# Patient Record
Sex: Female | Born: 1965 | Race: White | Hispanic: No | Marital: Married | Smoking: Former smoker
Health system: Southern US, Community
[De-identification: ages and names within clinical notes are randomized; demographics above are authoritative.]

## PROBLEM LIST (undated history)

## (undated) DIAGNOSIS — M542 Cervicalgia: Secondary | ICD-10-CM

## (undated) DIAGNOSIS — M48061 Spinal stenosis, lumbar region without neurogenic claudication: Secondary | ICD-10-CM

## (undated) DIAGNOSIS — K219 Gastro-esophageal reflux disease without esophagitis: Secondary | ICD-10-CM

## (undated) DIAGNOSIS — E785 Hyperlipidemia, unspecified: Secondary | ICD-10-CM

## (undated) HISTORY — DX: Cervicalgia: M54.2

## (undated) HISTORY — DX: Spinal stenosis, lumbar region without neurogenic claudication: M48.061

## (undated) HISTORY — DX: Hyperlipidemia, unspecified: E78.5

## (undated) HISTORY — PX: SPINAL FUSION: SHX223

## (undated) HISTORY — PX: BACK SURGERY: SHX140

## (undated) HISTORY — DX: Gastro-esophageal reflux disease without esophagitis: K21.9

## (undated) HISTORY — PX: WISDOM TOOTH EXTRACTION: SHX21

## (undated) HISTORY — PX: ANKLE SURGERY: SHX546

---

## 2003-02-12 ENCOUNTER — Other Ambulatory Visit: Admission: RE | Admit: 2003-02-12 | Discharge: 2003-02-12 | Payer: Self-pay | Admitting: Obstetrics and Gynecology

## 2004-02-20 ENCOUNTER — Other Ambulatory Visit: Admission: RE | Admit: 2004-02-20 | Discharge: 2004-02-20 | Payer: Self-pay | Admitting: Obstetrics and Gynecology

## 2004-11-25 ENCOUNTER — Encounter: Admission: RE | Admit: 2004-11-25 | Discharge: 2004-11-25 | Payer: Self-pay | Admitting: Family Medicine

## 2005-03-06 ENCOUNTER — Other Ambulatory Visit: Admission: RE | Admit: 2005-03-06 | Discharge: 2005-03-06 | Payer: Self-pay | Admitting: Obstetrics and Gynecology

## 2006-12-28 ENCOUNTER — Emergency Department: Payer: Self-pay | Admitting: Emergency Medicine

## 2007-07-18 ENCOUNTER — Ambulatory Visit: Payer: Self-pay | Admitting: Unknown Physician Specialty

## 2007-08-23 ENCOUNTER — Ambulatory Visit: Payer: Self-pay | Admitting: Pain Medicine

## 2007-09-12 ENCOUNTER — Ambulatory Visit: Payer: Self-pay | Admitting: Pain Medicine

## 2007-10-13 ENCOUNTER — Ambulatory Visit: Payer: Self-pay | Admitting: Pain Medicine

## 2007-11-14 ENCOUNTER — Ambulatory Visit: Payer: Self-pay | Admitting: Pain Medicine

## 2007-12-15 ENCOUNTER — Ambulatory Visit: Payer: Self-pay | Admitting: Pain Medicine

## 2008-01-02 ENCOUNTER — Ambulatory Visit: Payer: Self-pay | Admitting: Pain Medicine

## 2008-01-30 ENCOUNTER — Ambulatory Visit: Payer: Self-pay | Admitting: Pain Medicine

## 2008-02-28 ENCOUNTER — Ambulatory Visit: Payer: Self-pay | Admitting: Pain Medicine

## 2008-04-17 ENCOUNTER — Ambulatory Visit: Payer: Self-pay | Admitting: Neurosurgery

## 2008-08-14 ENCOUNTER — Encounter: Payer: Self-pay | Admitting: Orthopedic Surgery

## 2008-08-28 ENCOUNTER — Encounter: Payer: Self-pay | Admitting: Orthopedic Surgery

## 2008-09-27 ENCOUNTER — Encounter: Payer: Self-pay | Admitting: Orthopedic Surgery

## 2009-06-03 ENCOUNTER — Encounter: Admission: RE | Admit: 2009-06-03 | Discharge: 2009-06-03 | Payer: Self-pay | Admitting: Obstetrics and Gynecology

## 2013-01-31 ENCOUNTER — Encounter: Payer: Self-pay | Admitting: Podiatry

## 2013-01-31 ENCOUNTER — Ambulatory Visit (INDEPENDENT_AMBULATORY_CARE_PROVIDER_SITE_OTHER): Payer: 59 | Admitting: Podiatry

## 2013-01-31 VITALS — BP 115/71 | HR 69 | Resp 16 | Ht 65.0 in | Wt 172.0 lb

## 2013-01-31 DIAGNOSIS — M722 Plantar fascial fibromatosis: Secondary | ICD-10-CM

## 2013-01-31 NOTE — Patient Instructions (Signed)

## 2013-01-31 NOTE — Progress Notes (Signed)
Subjective:     Patient ID: Alicia Woods, female   DOB: Nov 22, 1965, 47 y.o.   MRN: [id number]  HPI patient states I am doing pretty well. States that she is still having pain when on her feet for too long a period of time   Review of Systems  All other systems reviewed and are negative.       Objective:   Physical Exam  Nursing note and vitals reviewed. Constitutional: She is oriented to person, place, and time.  Cardiovascular: Intact distal pulses.   Neurological: She is oriented to person, place, and time.  Skin: Skin is warm.   patient does have structural abnormality of both feet consistent with flatfoot deformity and structural bunion and heel pain-like condition     Assessment:     Chronic structural issues secondary to foot structure and mechanical dysfunction    Plan:     Explained condition the patient and recommended second type of orthotic. Dispense primary orthotic today with instructions on use

## 2013-02-14 ENCOUNTER — Encounter: Payer: Self-pay | Admitting: Podiatry

## 2013-02-14 ENCOUNTER — Ambulatory Visit (INDEPENDENT_AMBULATORY_CARE_PROVIDER_SITE_OTHER): Payer: 59

## 2013-02-14 ENCOUNTER — Ambulatory Visit (INDEPENDENT_AMBULATORY_CARE_PROVIDER_SITE_OTHER): Payer: 59 | Admitting: Podiatry

## 2013-02-14 VITALS — BP 111/63 | HR 69 | Resp 16 | Ht 65.0 in | Wt 173.0 lb

## 2013-02-14 DIAGNOSIS — Z5189 Encounter for other specified aftercare: Secondary | ICD-10-CM

## 2013-02-14 DIAGNOSIS — S96911D Strain of unspecified muscle and tendon at ankle and foot level, right foot, subsequent encounter: Secondary | ICD-10-CM

## 2013-02-14 DIAGNOSIS — M79609 Pain in unspecified limb: Secondary | ICD-10-CM

## 2013-02-14 DIAGNOSIS — M79671 Pain in right foot: Secondary | ICD-10-CM

## 2013-02-14 NOTE — Progress Notes (Signed)
Subjective:     Patient ID: Alicia Woods, female   DOB: [birth year], 47 y.o.   MRN: [id number]  HPI patient states I cannot wear my orthotics because I felt a pop in my right foot and now my arch is killing me and wearing orthotics is painful date she is having trouble walking due to the injury she sustained   Review of Systems     Objective:   Physical Exam  Nursing note and vitals reviewed. Constitutional: She is oriented to person, place, and time.  Cardiovascular: Intact distal pulses.   Musculoskeletal: Normal range of motion.  Neurological: She is oriented to person, place, and time.  Skin: Skin is warm.   patient is found to have pain in the arch right and upon comparison I do think there has been tearing of part of the plantar fascia with no bruising noted it within the arch    Assessment:     Partial tear of the plantar fascia right foot    Plan:     Education concerning condition given to patient and today I applied air fracture walker with instructions on heat ice and immobilization of the arch to allow healing reappoint her recheck in 2-3 weeks

## 2013-03-10 ENCOUNTER — Ambulatory Visit: Payer: 59 | Admitting: Podiatry

## 2013-03-21 ENCOUNTER — Ambulatory Visit (INDEPENDENT_AMBULATORY_CARE_PROVIDER_SITE_OTHER): Payer: 59 | Admitting: Podiatry

## 2013-03-21 ENCOUNTER — Encounter: Payer: Self-pay | Admitting: Podiatry

## 2013-03-21 VITALS — BP 109/60 | HR 78 | Resp 16 | Ht 65.0 in | Wt 183.0 lb

## 2013-03-21 DIAGNOSIS — M722 Plantar fascial fibromatosis: Secondary | ICD-10-CM

## 2013-03-21 MED ORDER — TRIAMCINOLONE ACETONIDE 10 MG/ML IJ SUSP
10.0000 mg | Freq: Once | INTRAMUSCULAR | Status: AC
  Administered 2013-03-21: 10 mg

## 2013-03-21 NOTE — Progress Notes (Signed)
Subjective:     Patient ID: Margit Banda, female   DOB: 21-Nov-1965, 47 y.o.   MRN: [id number]  HPI patient presents stating I was doing really well but now the pain has moved to a new position more distal and more medial on my foot stating this one spot is very sore   Review of Systems     Objective:   Physical Exam Neurovascular status unchanged with no edema noted the foot but quite a bit of pain in the right arch distal to the insertion into the calcaneus on the medial side    Assessment:     Appears to be an inflammatory area more medial than where previous injury was present    Plan:     Did a very careful superficial injection 3 mg Kenalog 5 of Xylocaine Marcaine mixture and advised on boot usage for the next several weeks. Reappoint her recheck in 4 weeks

## 2013-04-18 ENCOUNTER — Encounter: Payer: Self-pay | Admitting: Podiatry

## 2013-04-18 ENCOUNTER — Ambulatory Visit (INDEPENDENT_AMBULATORY_CARE_PROVIDER_SITE_OTHER): Payer: 59 | Admitting: Podiatry

## 2013-04-18 VITALS — BP 124/74 | HR 78 | Resp 12

## 2013-04-18 DIAGNOSIS — M722 Plantar fascial fibromatosis: Secondary | ICD-10-CM

## 2013-04-18 NOTE — Progress Notes (Signed)
Subjective:     Patient ID: Alicia Woods, female   DOB: 11-Sep-1965, 48 y.o.   MRN: [id number]  HPI patient states that this spot on her right foot is still hurting and her orthotics are not comfortable   Review of Systems     Objective:   Physical Exam Neurovascular status intact with continued discomfort in the medial side of the right foot with inflammation noted patient is found to have diminished discomfort but still present    Assessment:     Continued plantar fascial symptomatology with also possible tear of some of the fibers which may be contributed into the pain she is experiencing    Plan:     Reviewed importance of boot and we're going to try to change her orthotic in a different direction. I want her to start tramadol which she states she has at home and if she needs any she will call us

## 2013-05-09 ENCOUNTER — Encounter: Payer: Self-pay | Admitting: *Deleted

## 2013-05-09 NOTE — Progress Notes (Signed)
SENT PT POSTCARD LETTING HER KNOW ORTHOTICS ARE IN. 

## 2013-05-12 ENCOUNTER — Encounter: Payer: Self-pay | Admitting: Podiatry

## 2013-05-15 DIAGNOSIS — M79673 Pain in unspecified foot: Secondary | ICD-10-CM | POA: Insufficient documentation

## 2013-05-15 DIAGNOSIS — M7750 Other enthesopathy of unspecified foot: Secondary | ICD-10-CM | POA: Insufficient documentation

## 2013-05-15 DIAGNOSIS — Q667 Congenital pes cavus, unspecified foot: Secondary | ICD-10-CM | POA: Insufficient documentation

## 2013-05-15 DIAGNOSIS — M722 Plantar fascial fibromatosis: Secondary | ICD-10-CM | POA: Insufficient documentation

## 2014-01-12 ENCOUNTER — Emergency Department: Payer: Self-pay | Admitting: Emergency Medicine

## 2014-01-12 LAB — COMPREHENSIVE METABOLIC PANEL
ALBUMIN: 3.3 g/dL — AB (ref 3.4–5.0)
ALT: 89 U/L — AB
AST: 88 U/L — AB (ref 15–37)
Alkaline Phosphatase: 39 U/L — ABNORMAL LOW
Anion Gap: 7 (ref 7–16)
BILIRUBIN TOTAL: 0.6 mg/dL (ref 0.2–1.0)
BUN: 17 mg/dL (ref 7–18)
Calcium, Total: 8.1 mg/dL — ABNORMAL LOW (ref 8.5–10.1)
Chloride: 108 mmol/L — ABNORMAL HIGH (ref 98–107)
Co2: 25 mmol/L (ref 21–32)
Creatinine: 0.73 mg/dL (ref 0.60–1.30)
EGFR (African American): >60
GLUCOSE: 95 mg/dL (ref 65–99)
OSMOLALITY: 281 (ref 275–301)
Potassium: 3.5 mmol/L (ref 3.5–5.1)
Sodium: 140 mmol/L (ref 136–145)
TOTAL PROTEIN: 6.9 g/dL (ref 6.4–8.2)

## 2014-01-12 LAB — URINALYSIS, COMPLETE
Bilirubin,UR: NEGATIVE
Glucose,UR: NEGATIVE mg/dL (ref 0–75)
KETONE: NEGATIVE
Nitrite: NEGATIVE
PH: 7 (ref 4.5–8.0)
PROTEIN: NEGATIVE
RBC,UR: 3 /HPF (ref 0–5)
SPECIFIC GRAVITY: 1.016 (ref 1.003–1.030)
Squamous Epithelial: 4

## 2014-01-12 LAB — CBC
HCT: 37.5 % (ref 35.0–47.0)
HGB: 12.8 g/dL (ref 12.0–16.0)
MCH: 32 pg (ref 26.0–34.0)
MCHC: 34.1 g/dL (ref 32.0–36.0)
MCV: 94 fL (ref 80–100)
PLATELETS: 333 10*3/uL (ref 150–440)
RBC: 4 10*6/uL (ref 3.80–5.20)
RDW: 12.2 % (ref 11.5–14.5)
WBC: 8.3 10*3/uL (ref 3.6–11.0)

## 2014-01-12 LAB — TROPONIN I

## 2014-01-12 LAB — CK TOTAL AND CKMB (NOT AT ARMC)
CK, Total: 111 U/L
CK-MB: 1.1 ng/mL (ref 0.5–3.6)

## 2014-01-22 DIAGNOSIS — M546 Pain in thoracic spine: Secondary | ICD-10-CM | POA: Insufficient documentation

## 2014-01-22 DIAGNOSIS — M5116 Intervertebral disc disorders with radiculopathy, lumbar region: Secondary | ICD-10-CM | POA: Insufficient documentation

## 2014-02-07 ENCOUNTER — Ambulatory Visit: Payer: Self-pay | Admitting: Adult Health Nurse Practitioner

## 2014-02-07 LAB — HCG, QUANTITATIVE, PREGNANCY

## 2014-03-13 ENCOUNTER — Other Ambulatory Visit: Payer: Self-pay | Admitting: Neurosurgery

## 2014-03-13 DIAGNOSIS — M48061 Spinal stenosis, lumbar region without neurogenic claudication: Secondary | ICD-10-CM

## 2014-03-21 ENCOUNTER — Ambulatory Visit
Admission: RE | Admit: 2014-03-21 | Discharge: 2014-03-21 | Disposition: A | Discharge status: Home / Self Care | Payer: PRIVATE HEALTH INSURANCE | Source: Ambulatory Visit | Attending: Neurosurgery | Admitting: Neurosurgery

## 2014-03-21 DIAGNOSIS — M48061 Spinal stenosis, lumbar region without neurogenic claudication: Secondary | ICD-10-CM

## 2014-03-21 MED ORDER — IOHEXOL 180 MG/ML  SOLN
1.0000 mL | Freq: Once | INTRAMUSCULAR | Status: AC | PRN
  Administered 2014-03-21: 1 mL via EPIDURAL

## 2014-03-21 MED ORDER — METHYLPREDNISOLONE ACETATE 40 MG/ML INJ SUSP (RADIOLOG
120.0000 mg | Freq: Once | INTRAMUSCULAR | Status: AC
  Administered 2014-03-21: 120 mg via EPIDURAL

## 2014-03-21 NOTE — Discharge Instructions (Signed)

## 2015-12-26 ENCOUNTER — Other Ambulatory Visit: Payer: Self-pay | Admitting: Advanced Practice Midwife

## 2015-12-26 DIAGNOSIS — Z1231 Encounter for screening mammogram for malignant neoplasm of breast: Secondary | ICD-10-CM

## 2016-01-23 ENCOUNTER — Encounter: Payer: Self-pay | Admitting: Radiology

## 2016-01-23 ENCOUNTER — Ambulatory Visit
Admission: RE | Admit: 2016-01-23 | Discharge: 2016-01-23 | Disposition: A | Discharge status: Home / Self Care | Payer: 59 | Source: Ambulatory Visit | Attending: Advanced Practice Midwife | Admitting: Advanced Practice Midwife

## 2016-01-23 DIAGNOSIS — Z1231 Encounter for screening mammogram for malignant neoplasm of breast: Secondary | ICD-10-CM | POA: Diagnosis not present

## 2016-04-22 ENCOUNTER — Other Ambulatory Visit: Payer: Self-pay | Admitting: Physical Medicine and Rehabilitation

## 2016-04-22 DIAGNOSIS — M5412 Radiculopathy, cervical region: Secondary | ICD-10-CM

## 2016-04-30 ENCOUNTER — Ambulatory Visit
Admission: RE | Admit: 2016-04-30 | Discharge: 2016-04-30 | Disposition: A | Discharge status: Home / Self Care | Payer: 59 | Source: Ambulatory Visit | Attending: Physical Medicine and Rehabilitation | Admitting: Physical Medicine and Rehabilitation

## 2016-04-30 DIAGNOSIS — M4682 Other specified inflammatory spondylopathies, cervical region: Secondary | ICD-10-CM | POA: Diagnosis not present

## 2016-04-30 DIAGNOSIS — M50223 Other cervical disc displacement at C6-C7 level: Secondary | ICD-10-CM | POA: Insufficient documentation

## 2016-04-30 DIAGNOSIS — M5412 Radiculopathy, cervical region: Secondary | ICD-10-CM

## 2016-04-30 DIAGNOSIS — M7138 Other bursal cyst, other site: Secondary | ICD-10-CM | POA: Insufficient documentation

## 2016-04-30 DIAGNOSIS — M4802 Spinal stenosis, cervical region: Secondary | ICD-10-CM | POA: Insufficient documentation

## 2016-12-07 ENCOUNTER — Other Ambulatory Visit: Payer: Self-pay

## 2016-12-07 ENCOUNTER — Telehealth: Payer: Self-pay

## 2016-12-07 MED ORDER — NORETHIN-ETH ESTRAD-FE BIPHAS 1 MG-10 MCG / 10 MCG PO TABS: 1.0000 | ORAL_TABLET | Freq: Every day | ORAL | 0 refills | Status: DC

## 2016-12-07 NOTE — Telephone Encounter (Signed)
I don't see message on this.

## 2016-12-07 NOTE — Telephone Encounter (Signed)
I thought I had put a note in here, pt needs OCP refill to last to appt 12/23/2016 with [facility]. 1 refill sent. Pt aware via vm

## 2016-12-09 ENCOUNTER — Other Ambulatory Visit: Payer: Self-pay

## 2016-12-09 ENCOUNTER — Telehealth: Payer: Self-pay

## 2016-12-09 NOTE — Telephone Encounter (Signed)
Trying to call pt back to let her know I have fixed the RX with [facility]. No answer. Please let her know when she calls back.

## 2016-12-14 ENCOUNTER — Other Ambulatory Visit: Payer: Self-pay

## 2016-12-14 ENCOUNTER — Telehealth: Payer: Self-pay

## 2016-12-14 MED ORDER — NORETHIN-ETH ESTRAD-FE BIPHAS 1 MG-10 MCG / 10 MCG PO TABS: 1.0000 | ORAL_TABLET | Freq: Every day | ORAL | 0 refills | Status: DC

## 2016-12-14 NOTE — Telephone Encounter (Signed)
Pt called, [facility] has not received rx.  Adv refill would be eRx'd.

## 2016-12-14 NOTE — Progress Notes (Signed)
Pt states [facility] does not have 90d rx and [facility] won't fill it d/t it has to be mail order.  Adv pt I would eRx 90d supply to [facility].

## 2016-12-14 NOTE — Telephone Encounter (Signed)
Pt called after hour nurse 12/10/16 5:14pm stating a 28d supply was called in on her bcp.  It is mail order so she needs a 90d supply. [facility] [phone]. Left detailed msg for pt that BS has fixed the rx c [facility].  Pt to call back if not fixed.

## 2016-12-23 ENCOUNTER — Ambulatory Visit: Payer: 59 | Admitting: Obstetrics and Gynecology

## 2017-02-15 ENCOUNTER — Encounter: Payer: Self-pay | Admitting: Obstetrics and Gynecology

## 2017-02-15 ENCOUNTER — Ambulatory Visit (INDEPENDENT_AMBULATORY_CARE_PROVIDER_SITE_OTHER): Payer: 59 | Admitting: Obstetrics and Gynecology

## 2017-02-15 VITALS — BP 130/80 | HR 72 | Ht 65.5 in | Wt 190.0 lb

## 2017-02-15 DIAGNOSIS — Z1231 Encounter for screening mammogram for malignant neoplasm of breast: Secondary | ICD-10-CM

## 2017-02-15 DIAGNOSIS — Z3041 Encounter for surveillance of contraceptive pills: Secondary | ICD-10-CM

## 2017-02-15 DIAGNOSIS — Z1239 Encounter for other screening for malignant neoplasm of breast: Secondary | ICD-10-CM

## 2017-02-15 DIAGNOSIS — Z1151 Encounter for screening for human papillomavirus (HPV): Secondary | ICD-10-CM | POA: Diagnosis not present

## 2017-02-15 DIAGNOSIS — Z01419 Encounter for gynecological examination (general) (routine) without abnormal findings: Secondary | ICD-10-CM | POA: Diagnosis not present

## 2017-02-15 DIAGNOSIS — Z124 Encounter for screening for malignant neoplasm of cervix: Secondary | ICD-10-CM | POA: Diagnosis not present

## 2017-02-15 MED ORDER — NORETHIN-ETH ESTRAD-FE BIPHAS 1 MG-10 MCG / 10 MCG PO TABS: 1.0000 | ORAL_TABLET | Freq: Every day | ORAL | 3 refills | Status: DC

## 2017-02-15 NOTE — Patient Instructions (Signed)
I value your feedback and entrusting us with your care. If you get a [facility] patient survey, I would appreciate you taking the time to let us know about your experience today. Thank you! 

## 2017-02-15 NOTE — Progress Notes (Signed)
PCP:  Maryland Pink, MD   Chief Complaint  Patient presents with  . Gynecologic Exam    TALK ABOUT BCPs ; needs refill also     HPI:      Alicia Woods is a 51 y.o. No obstetric history on file. who LMP was No LMP recorded. Patient is postmenopausal., presents today for her annual examination.  Her menses are absent with cont dosing Lo Loestrin OCPs.   Dysmenorrhea none. She does not have intermenstrual bleeding.  Sex activity: single partner, contraception - OCP (estrogen/progesterone).  Last Pap: October 06, 2013  Results were: no abnormalities /neg HPV DNA  Hx of STDs: none  Last mammogram: January 23, 2016  Results were: normal--routine follow-up in 12 months There is a FH of breast cancer in her MGM, age unkown. There is no FH of ovarian cancer. The patient does not do self-breast exams.  Tobacco use: The patient denies current or previous tobacco use. Alcohol use: none No drug use.  Exercise: not active  She does get adequate calcium and Vitamin D in her diet.  Labs with PCP.   Past Medical History:  Diagnosis Date  . Neck pain on left side    CAUSED BY CYST AND CAUSES SHOULDER PAIN;  DR. Sharlet Salina    Past Surgical History:  Procedure Laterality Date  . ANKLE SURGERY Left   . SPINAL FUSION    . WISDOM TOOTH EXTRACTION     TWO    Family History  Problem Relation Age of Onset  . Diabetes Mother   . Breast cancer Maternal Grandmother     Social History   Socioeconomic History  . Marital status: Married    Spouse name: Not on file  . Number of children: Not on file  . Years of education: Not on file  . Highest education level: Not on file  Social Needs  . Financial resource strain: Not on file  . Food insecurity - worry: Not on file  . Food insecurity - inability: Not on file  . Transportation needs - medical: Not on file  . Transportation needs - non-medical: Not on file  Occupational History  . Not on file  Tobacco Use  . Smoking status:  Former Research scientist (life sciences)  . Smokeless tobacco: Never Used  . Tobacco comment: quit 2009  Substance and Sexual Activity  . Alcohol use: Yes    Comment: social   . Drug use: No  . Sexual activity: Yes    Birth control/protection: Post-menopausal  Other Topics Concern  . Not on file  Social History Narrative  . Not on file    Current Meds  Medication Sig  . Calcium Citrate 250 MG TABS Take 1 capsule daily by mouth.  . Cholecalciferol (VITAMIN D3) 2000 units capsule Take 1 capsule daily by mouth.  . cyclobenzaprine (FLEXERIL) 5 MG tablet Take 1 tablet as needed by mouth.  . gabapentin (NEURONTIN) 300 MG capsule Take 1 capsule daily by mouth.  Marland Kitchen omeprazole (PRILOSEC) 20 MG capsule Take 1 capsule daily by mouth.  . Potassium 99 MG TABS Take 1 tablet daily by mouth.  Marland Kitchen VITAMIN B COMPLEX-C CAPS Take 1 capsule daily by mouth.  . [DISCONTINUED] norethindrone-ethinyl estradiol (JUNEL FE,GILDESS FE,LOESTRIN FE) 1-20 MG-MCG tablet Take 1 tablet daily by mouth.     ROS:  Review of Systems  Constitutional: Negative for fatigue, fever and unexpected weight change.  Respiratory: Negative for cough, shortness of breath and wheezing.   Cardiovascular: Negative for chest  pain, palpitations and leg swelling.  Gastrointestinal: Negative for blood in stool, constipation, diarrhea, nausea and vomiting.  Endocrine: Negative for cold intolerance, heat intolerance and polyuria.  Genitourinary: Negative for dyspareunia, dysuria, flank pain, frequency, genital sores, hematuria, menstrual problem, pelvic pain, urgency, vaginal bleeding, vaginal discharge and vaginal pain.  Musculoskeletal: Negative for back pain, joint swelling and myalgias.  Skin: Negative for rash.  Neurological: Negative for dizziness, syncope, light-headedness, numbness and headaches.  Hematological: Negative for adenopathy.  Psychiatric/Behavioral: Negative for agitation, confusion, sleep disturbance and suicidal ideas. The patient is not  nervous/anxious.      Objective: BP 130/80   Pulse 72   Ht 5' 5.5" (1.664 m)   Wt 190 lb (86.2 kg)   BMI 31.14 kg/m    Physical Exam  Constitutional: She is oriented to person, place, and time. She appears well-developed and well-nourished.  Genitourinary: Vagina normal and uterus normal. There is no rash or tenderness on the right labia. There is no rash or tenderness on the left labia. No erythema or tenderness in the vagina. No vaginal discharge found. Right adnexum does not display mass and does not display tenderness. Left adnexum does not display mass and does not display tenderness. Cervix does not exhibit motion tenderness or polyp. Uterus is not enlarged or tender.  Neck: Normal range of motion. No thyromegaly present.  Cardiovascular: Normal rate, regular rhythm and normal heart sounds.  No murmur heard. Pulmonary/Chest: Effort normal and breath sounds normal. Right breast exhibits no mass, no nipple discharge, no skin change and no tenderness. Left breast exhibits no mass, no nipple discharge, no skin change and no tenderness.  Abdominal: Soft. There is no tenderness. There is no guarding.  Musculoskeletal: Normal range of motion.  Neurological: She is alert and oriented to person, place, and time. No cranial nerve deficit.  Psychiatric: She has a normal mood and affect. Her behavior is normal.  Vitals reviewed.   Assessment/Plan: Encounter for annual routine gynecological examination  Cervical cancer screening - Plan: IGP, Aptima HPV  Screening for HPV (human papillomavirus) - Plan: IGP, Aptima HPV  Screening for breast cancer - Pt to sched mammo. - Plan: MM DIGITAL SCREENING BILATERAL  Encounter for surveillance of contraceptive pills - OCP RF. Will re-eval need next yr. Try placebo pills to see if has any withdrawal bleeding. - Plan: Norethindrone-Ethinyl Estradiol-Fe Biphas (LO LOESTRIN FE) 1 MG-10 MCG / 10 MCG tablet            GYN counsel breast self exam,  mammography screening, use and side effects of OCP's, adequate intake of calcium and vitamin D, diet and exercise     F/U  Return in about 1 year (around 02/15/2018).  Alicia B. Copland, PA-C 02/15/2017 8:51 AM

## 2017-02-17 LAB — IGP, APTIMA HPV
HPV Aptima: NEGATIVE
PAP Smear Comment: 0

## 2017-03-09 ENCOUNTER — Ambulatory Visit
Admission: RE | Admit: 2017-03-09 | Discharge: 2017-03-09 | Disposition: A | Discharge status: Home / Self Care | Payer: 59 | Source: Ambulatory Visit | Attending: Obstetrics and Gynecology | Admitting: Obstetrics and Gynecology

## 2017-03-09 DIAGNOSIS — N6320 Unspecified lump in the left breast, unspecified quadrant: Secondary | ICD-10-CM | POA: Insufficient documentation

## 2017-03-09 DIAGNOSIS — Z1231 Encounter for screening mammogram for malignant neoplasm of breast: Secondary | ICD-10-CM | POA: Diagnosis present

## 2017-03-09 DIAGNOSIS — R928 Other abnormal and inconclusive findings on diagnostic imaging of breast: Secondary | ICD-10-CM | POA: Insufficient documentation

## 2017-03-09 DIAGNOSIS — Z1239 Encounter for other screening for malignant neoplasm of breast: Secondary | ICD-10-CM

## 2017-03-09 DIAGNOSIS — N6310 Unspecified lump in the right breast, unspecified quadrant: Secondary | ICD-10-CM | POA: Insufficient documentation

## 2017-03-11 ENCOUNTER — Other Ambulatory Visit: Payer: Self-pay | Admitting: Obstetrics and Gynecology

## 2017-03-11 DIAGNOSIS — N6320 Unspecified lump in the left breast, unspecified quadrant: Secondary | ICD-10-CM

## 2017-03-11 DIAGNOSIS — R928 Other abnormal and inconclusive findings on diagnostic imaging of breast: Secondary | ICD-10-CM

## 2017-03-11 DIAGNOSIS — N6310 Unspecified lump in the right breast, unspecified quadrant: Secondary | ICD-10-CM

## 2017-03-29 ENCOUNTER — Other Ambulatory Visit: Payer: 59

## 2017-03-29 ENCOUNTER — Ambulatory Visit: Payer: 59

## 2017-04-06 ENCOUNTER — Ambulatory Visit
Admission: RE | Admit: 2017-04-06 | Discharge: 2017-04-06 | Disposition: A | Discharge status: Home / Self Care | Payer: 59 | Source: Ambulatory Visit | Attending: Obstetrics and Gynecology | Admitting: Obstetrics and Gynecology

## 2017-04-06 ENCOUNTER — Encounter: Payer: Self-pay | Admitting: Obstetrics and Gynecology

## 2017-04-06 DIAGNOSIS — R928 Other abnormal and inconclusive findings on diagnostic imaging of breast: Secondary | ICD-10-CM | POA: Diagnosis not present

## 2017-04-06 DIAGNOSIS — N6310 Unspecified lump in the right breast, unspecified quadrant: Secondary | ICD-10-CM

## 2017-04-06 DIAGNOSIS — N6320 Unspecified lump in the left breast, unspecified quadrant: Secondary | ICD-10-CM

## 2017-04-06 DIAGNOSIS — N6001 Solitary cyst of right breast: Secondary | ICD-10-CM | POA: Diagnosis not present

## 2017-11-22 ENCOUNTER — Other Ambulatory Visit: Payer: Self-pay

## 2017-11-22 DIAGNOSIS — Z3041 Encounter for surveillance of contraceptive pills: Secondary | ICD-10-CM

## 2017-11-22 MED ORDER — NORETHIN-ETH ESTRAD-FE BIPHAS 1 MG-10 MCG / 10 MCG PO TABS: 1.0000 | ORAL_TABLET | Freq: Every day | ORAL | 0 refills | Status: DC

## 2018-01-13 ENCOUNTER — Other Ambulatory Visit: Payer: Self-pay | Admitting: Family Medicine

## 2018-01-13 DIAGNOSIS — Z1231 Encounter for screening mammogram for malignant neoplasm of breast: Secondary | ICD-10-CM

## 2018-01-18 ENCOUNTER — Ambulatory Visit
Admission: RE | Admit: 2018-01-18 | Discharge: 2018-01-18 | Disposition: A | Discharge status: Home / Self Care | Payer: 59 | Source: Ambulatory Visit | Attending: Family Medicine | Admitting: Family Medicine

## 2018-01-18 ENCOUNTER — Ambulatory Visit: Payer: PRIVATE HEALTH INSURANCE

## 2018-01-18 DIAGNOSIS — Z1231 Encounter for screening mammogram for malignant neoplasm of breast: Secondary | ICD-10-CM | POA: Diagnosis not present

## 2018-02-16 ENCOUNTER — Encounter: Payer: Self-pay | Admitting: Obstetrics and Gynecology

## 2018-02-16 ENCOUNTER — Ambulatory Visit (INDEPENDENT_AMBULATORY_CARE_PROVIDER_SITE_OTHER): Payer: PRIVATE HEALTH INSURANCE | Admitting: Obstetrics and Gynecology

## 2018-02-16 VITALS — BP 108/74 | HR 82 | Ht 65.5 in | Wt 180.0 lb

## 2018-02-16 DIAGNOSIS — Z1211 Encounter for screening for malignant neoplasm of colon: Secondary | ICD-10-CM

## 2018-02-16 DIAGNOSIS — Z01419 Encounter for gynecological examination (general) (routine) without abnormal findings: Secondary | ICD-10-CM

## 2018-02-16 DIAGNOSIS — Z1239 Encounter for other screening for malignant neoplasm of breast: Secondary | ICD-10-CM

## 2018-02-16 DIAGNOSIS — Z3041 Encounter for surveillance of contraceptive pills: Secondary | ICD-10-CM

## 2018-02-16 MED ORDER — NORETHIN-ETH ESTRAD-FE BIPHAS 1 MG-10 MCG / 10 MCG PO TABS: 1.0000 | ORAL_TABLET | Freq: Every day | ORAL | 3 refills | Status: DC

## 2018-02-16 NOTE — Progress Notes (Signed)
PCP:  Maryland Pink, MD   Chief Complaint  Patient presents with  . Gynecologic Exam     HPI:      [name] is a 52 y.o. No obstetric history on file. who LMP was No LMP recorded. Patient is postmenopausal., presents today for her annual examination.  Her menses are absent with cont dosing Lo Loestrin OCPs. She tried doing placebo pills this yr and had withdrawal bleed.  Dysmenorrhea none. She does not have intermenstrual bleeding.  Sex activity: single partner, contraception - OCP (estrogen/progesterone).  Last Pap: 02/15/17  Results were: no abnormalities /neg HPV DNA  Hx of STDs: none  Last mammogram: 01/18/18  Results were: normal--routine follow-up in 12 months There is a FH of breast cancer in her MGM, age unkown. There is no FH of ovarian cancer. The patient does not do self-breast exams.  Tobacco use: The patient denies current or previous tobacco use. Alcohol use: none No drug use.  Exercise: not active  Colonoscopy: never  She does get adequate calcium and Vitamin D in her diet.  Labs with PCP.   Past Medical History:  Diagnosis Date  . Esophageal reflux   . Neck pain on left side    CAUSED BY CYST AND CAUSES SHOULDER PAIN;  DR. Sharlet Salina    Past Surgical History:  Procedure Laterality Date  . ANKLE SURGERY Left   . SPINAL FUSION    . WISDOM TOOTH EXTRACTION     TWO    Family History  Problem Relation Age of Onset  . Diabetes Mother        Type 1  . Breast cancer Maternal Grandmother        age unknown  . Other Father        Polio  . Muscular dystrophy Sister     Social History   Socioeconomic History  . Marital status: Married    Spouse name: Not on file  . Number of children: Not on file  . Years of education: Not on file  . Highest education level: Not on file  Occupational History  . Not on file  Social Needs  . Financial resource strain: Not on file  . Food insecurity:    Worry: Not on file    Inability: Not on file    . Transportation needs:    Medical: Not on file    Non-medical: Not on file  Tobacco Use  . Smoking status: Former Research scientist (life sciences)  . Smokeless tobacco: Never Used  . Tobacco comment: quit 2009  Substance and Sexual Activity  . Alcohol use: Yes    Comment: social   . Drug use: No  . Sexual activity: Yes    Birth control/protection: Post-menopausal  Lifestyle  . Physical activity:    Days per week: Not on file    Minutes per session: Not on file  . Stress: Not on file  Relationships  . Social connections:    Talks on phone: Not on file    Gets together: Not on file    Attends religious service: Not on file    Active member of club or organization: Not on file    Attends meetings of clubs or organizations: Not on file    Relationship status: Not on file  . Intimate partner violence:    Fear of current or ex partner: Not on file    Emotionally abused: Not on file    Physically abused: Not on file    Forced  sexual activity: Not on file  Other Topics Concern  . Not on file  Social History Narrative  . Not on file    Current Meds  Medication Sig  . Calcium Citrate 250 MG TABS Take 1 capsule daily by mouth.  . Cholecalciferol (VITAMIN D3) 2000 units capsule Take 1 capsule daily by mouth.  . diphenhydrAMINE (BENADRYL) 25 mg capsule Take 1-2 tabs PO QHS  . gabapentin (NEURONTIN) 300 MG capsule Take 1 capsule daily by mouth.  . Norethindrone-Ethinyl Estradiol-Fe Biphas (LO LOESTRIN FE) 1 MG-10 MCG / 10 MCG tablet Take 1 tablet by mouth daily.  Marland Kitchen omeprazole (PRILOSEC) 20 MG capsule Take 1 capsule daily by mouth.  . Potassium 99 MG TABS Take 1 tablet daily by mouth.  Marland Kitchen VITAMIN B COMPLEX-C CAPS Take 1 capsule daily by mouth.  . [DISCONTINUED] Norethindrone-Ethinyl Estradiol-Fe Biphas (LO LOESTRIN FE) 1 MG-10 MCG / 10 MCG tablet Take 1 tablet by mouth daily.     ROS:  Review of Systems  Constitutional: Negative for fatigue, fever and unexpected weight change.  Respiratory: Negative  for cough, shortness of breath and wheezing.   Cardiovascular: Negative for chest pain, palpitations and leg swelling.  Gastrointestinal: Negative for blood in stool, constipation, diarrhea, nausea and vomiting.  Endocrine: Negative for cold intolerance, heat intolerance and polyuria.  Genitourinary: Negative for dyspareunia, dysuria, flank pain, frequency, genital sores, hematuria, menstrual problem, pelvic pain, urgency, vaginal bleeding, vaginal discharge and vaginal pain.  Musculoskeletal: Negative for back pain, joint swelling and myalgias.  Skin: Negative for rash.  Neurological: Negative for dizziness, syncope, light-headedness, numbness and headaches.  Hematological: Negative for adenopathy.  Psychiatric/Behavioral: Negative for agitation, confusion, sleep disturbance and suicidal ideas. The patient is not nervous/anxious.      Objective: BP 108/74   Pulse 82   Ht 5' 5.5" (1.664 m)   Wt 180 lb (81.6 kg)   BMI 29.50 kg/m    Physical Exam  Constitutional: She is oriented to person, place, and time. She appears well-developed and well-nourished.  Genitourinary: Vagina normal and uterus normal. There is no rash or tenderness on the right labia. There is no rash or tenderness on the left labia. No erythema or tenderness in the vagina. No vaginal discharge found. Right adnexum does not display mass and does not display tenderness. Left adnexum does not display mass and does not display tenderness. Cervix does not exhibit motion tenderness or polyp. Uterus is not enlarged or tender.  Neck: Normal range of motion. No thyromegaly present.  Cardiovascular: Normal rate, regular rhythm and normal heart sounds.  No murmur heard. Pulmonary/Chest: Effort normal and breath sounds normal. Right breast exhibits no mass, no nipple discharge, no skin change and no tenderness. Left breast exhibits no mass, no nipple discharge, no skin change and no tenderness.  Abdominal: Soft. There is no  tenderness. There is no guarding.  Musculoskeletal: Normal range of motion.  Neurological: She is alert and oriented to person, place, and time. No cranial nerve deficit.  Psychiatric: She has a normal mood and affect. Her behavior is normal.  Vitals reviewed.   Assessment/Plan: Encounter for annual routine gynecological examination  Screening for breast cancer - Pt current on mammo  Encounter for surveillance of contraceptive pills - OCP RF. Re-eval need for them next yr due to age. - Plan: Norethindrone-Ethinyl Estradiol-Fe Biphas (LO LOESTRIN FE) 1 MG-10 MCG / 10 MCG tablet  Screening for colon cancer - Recommended scr colonoscopy due to age. Ref sent. - Plan: Ambulatory referral  to Gastroenterology            GYN counsel breast self exam, mammography screening, use and side effects of OCP's, adequate intake of calcium and vitamin D, diet and exercise     F/U  Return in about 1 year (around 02/17/2019).  Tierra Thoma B. Reily Ilic, PA-C 02/16/2018 4:58 PM

## 2018-02-16 NOTE — Patient Instructions (Signed)
I value your feedback and entrusting us with your care. If you get a [facility] patient survey, I would appreciate you taking the time to let us know about your experience today. Thank you! 

## 2018-02-17 ENCOUNTER — Other Ambulatory Visit: Payer: Self-pay

## 2018-02-17 DIAGNOSIS — Z1211 Encounter for screening for malignant neoplasm of colon: Secondary | ICD-10-CM

## 2018-03-07 ENCOUNTER — Encounter: Payer: Self-pay | Admitting: *Deleted

## 2018-03-07 ENCOUNTER — Encounter
Admission: RE | Disposition: A | Discharge status: Home / Self Care | Payer: Self-pay | Source: Home / Self Care | Attending: Gastroenterology

## 2018-03-07 ENCOUNTER — Ambulatory Visit: Payer: 59 | Admitting: Anesthesiology

## 2018-03-07 ENCOUNTER — Ambulatory Visit
Admission: RE | Admit: 2018-03-07 | Discharge: 2018-03-07 | Disposition: A | Discharge status: Home / Self Care | Payer: 59 | Attending: Gastroenterology | Admitting: Gastroenterology

## 2018-03-07 ENCOUNTER — Other Ambulatory Visit: Payer: Self-pay

## 2018-03-07 DIAGNOSIS — K219 Gastro-esophageal reflux disease without esophagitis: Secondary | ICD-10-CM | POA: Diagnosis not present

## 2018-03-07 DIAGNOSIS — Z79899 Other long term (current) drug therapy: Secondary | ICD-10-CM | POA: Diagnosis not present

## 2018-03-07 DIAGNOSIS — Z87891 Personal history of nicotine dependence: Secondary | ICD-10-CM | POA: Diagnosis not present

## 2018-03-07 DIAGNOSIS — Z1211 Encounter for screening for malignant neoplasm of colon: Secondary | ICD-10-CM | POA: Diagnosis present

## 2018-03-07 DIAGNOSIS — K621 Rectal polyp: Secondary | ICD-10-CM | POA: Diagnosis not present

## 2018-03-07 DIAGNOSIS — K635 Polyp of colon: Secondary | ICD-10-CM | POA: Insufficient documentation

## 2018-03-07 HISTORY — PX: COLONOSCOPY WITH PROPOFOL: SHX5780

## 2018-03-07 SURGERY — COLONOSCOPY WITH PROPOFOL
Anesthesia: General

## 2018-03-07 MED ORDER — LIDOCAINE HCL (CARDIAC) PF 100 MG/5ML IV SOSY
PREFILLED_SYRINGE | INTRAVENOUS | Status: DC | PRN
  Administered 2018-03-07: 50 mg via INTRAVENOUS

## 2018-03-07 MED ORDER — MIDAZOLAM HCL 2 MG/2ML IJ SOLN
INTRAMUSCULAR | Status: DC | PRN
  Administered 2018-03-07: 2 mg via INTRAVENOUS

## 2018-03-07 MED ORDER — LIDOCAINE HCL (PF) 2 % IJ SOLN
INTRAMUSCULAR | Status: AC
  Filled 2018-03-07: qty 10

## 2018-03-07 MED ORDER — PROPOFOL 10 MG/ML IV BOLUS
INTRAVENOUS | Status: DC | PRN
  Administered 2018-03-07: 90 mg via INTRAVENOUS

## 2018-03-07 MED ORDER — MIDAZOLAM HCL 2 MG/2ML IJ SOLN
INTRAMUSCULAR | Status: AC
  Filled 2018-03-07: qty 2

## 2018-03-07 MED ORDER — FENTANYL CITRATE (PF) 100 MCG/2ML IJ SOLN
INTRAMUSCULAR | Status: AC
  Filled 2018-03-07: qty 2

## 2018-03-07 MED ORDER — PROPOFOL 500 MG/50ML IV EMUL
INTRAVENOUS | Status: AC
  Filled 2018-03-07: qty 50

## 2018-03-07 MED ORDER — FENTANYL CITRATE (PF) 100 MCG/2ML IJ SOLN
INTRAMUSCULAR | Status: DC | PRN
  Administered 2018-03-07: 50 ug via INTRAVENOUS

## 2018-03-07 MED ORDER — SODIUM CHLORIDE 0.9 % IV SOLN
INTRAVENOUS | Status: DC
  Administered 2018-03-07: 09:00:00 via INTRAVENOUS

## 2018-03-07 MED ORDER — PROPOFOL 500 MG/50ML IV EMUL
INTRAVENOUS | Status: DC | PRN
  Administered 2018-03-07: 130 ug/kg/min via INTRAVENOUS

## 2018-03-07 NOTE — Op Note (Signed)
[facility] Gastroenterology Patient Name: Alicia Woods Procedure Date: 03/07/2018 9:28 AM MRN: [id number] Account #: [id number] Date of Birth: 1966-01-03 Admit Type: Outpatient Age: 52 Room: [facility] ENDO ROOM 2 Gender: Female Note Status: Finalized Procedure:            Colonoscopy Indications:          Screening for colorectal malignant neoplasm, This is                        the patient's first colonoscopy Providers:            Lin Landsman MD, MD Referring MD:         Irven Easterly. Kary Kos, MD (Referring MD) Medicines:            Monitored Anesthesia Care Complications:        No immediate complications. Estimated blood loss: None. Procedure:            Pre-Anesthesia Assessment:                       - Prior to the procedure, a History and Physical was                        performed, and patient medications and allergies were                        reviewed. The patient is competent. The risks and                        benefits of the procedure and the sedation options and                        risks were discussed with the patient. All questions                        were answered and informed consent was obtained.                        Patient identification and proposed procedure were                        verified by the physician, the nurse, the                        anesthesiologist, the anesthetist and the technician in                        the pre-procedure area in the procedure room in the                        endoscopy suite. Mental Status Examination: alert and                        oriented. Airway Examination: normal oropharyngeal                        airway and neck mobility. Respiratory Examination:                        clear to auscultation. CV Examination: normal.  Prophylactic Antibiotics: The patient does not require                        prophylactic antibiotics. Prior Anticoagulants: The                         patient has taken no previous anticoagulant or                        antiplatelet agents. ASA Grade Assessment: II - A                        patient with mild systemic disease. After reviewing the                        risks and benefits, the patient was deemed in                        satisfactory condition to undergo the procedure. The                        anesthesia plan was to use monitored anesthesia care                        (MAC). Immediately prior to administration of                        medications, the patient was re-assessed for adequacy                        to receive sedatives. The heart rate, respiratory rate,                        oxygen saturations, blood pressure, adequacy of                        pulmonary ventilation, and response to care were                        monitored throughout the procedure. The physical status                        of the patient was re-assessed after the procedure.                       After obtaining informed consent, the colonoscope was                        passed under direct vision. Throughout the procedure,                        the patient's blood pressure, pulse, and oxygen                        saturations were monitored continuously. The                        Colonoscope was introduced through the anus and  advanced to the the cecum, identified by appendiceal                        orifice and ileocecal valve. The colonoscopy was                        performed without difficulty. The patient tolerated the                        procedure well. The quality of the bowel preparation                        was evaluated using the BBPS [facility] Bowel Preparation                        Scale) with scores of: Right Colon = 3, Transverse                        Colon = 3 and Left Colon = 3 (entire mucosa seen well                        with no residual staining, small fragments of stool or                         opaque liquid). The total BBPS score equals 9. Findings:      The perianal and digital rectal examinations were normal. Pertinent       negatives include normal sphincter tone and no palpable rectal lesions.      A 5 mm polyp was found in the rectum. The polyp was sessile. The polyp       was removed with a cold snare. Resection and retrieval were complete.      The entire examined colon appeared normal.      The exam was otherwise without abnormality.      The retroflexed view of the distal rectum and anal verge was normal and       showed no anal or rectal abnormalities. Impression:           - One 5 mm polyp in the rectum, removed with a cold                        snare. Resected and retrieved.                       - The entire examined colon is normal.                       - The examination was otherwise normal.                       - The distal rectum and anal verge are normal on                        retroflexion view. Recommendation:       - Discharge patient to home (with spouse).                       - Resume previous diet today.                       -  Continue present medications.                       - Await pathology results.                       - Repeat colonoscopy in 5-10 years for surveillance                        based on pathology results. Procedure Code(s):    --- Professional ---                       [phone], Colonoscopy, flexible; with removal of tumor(s),                        polyp(s), or other lesion(s) by snare technique Diagnosis Code(s):    --- Professional ---                       Z12.11, Encounter for screening for malignant neoplasm                        of colon                       K62.1, Rectal polyp CPT copyright 2018 American Medical Association. All rights reserved. The codes documented in this report are preliminary and upon coder review may  be revised to meet current compliance requirements. Dr. Ulyess Mort Lin Landsman MD, MD 03/07/2018 9:48:58 AM This report has been signed electronically. Number of Addenda: 0 Note Initiated On: 03/07/2018 9:28 AM Scope Withdrawal Time: 0 hours 8 minutes 57 seconds  Total Procedure Duration: 0 hours 13 minutes 37 seconds       [facility]

## 2018-03-07 NOTE — Anesthesia Postprocedure Evaluation (Signed)
Anesthesia Post Note  Patient: Alicia Woods  Procedure(s) Performed: COLONOSCOPY WITH PROPOFOL (N/A )  Patient location during evaluation: Endoscopy Anesthesia Type: General Level of consciousness: awake and alert Pain management: pain level controlled Vital Signs Assessment: post-procedure vital signs reviewed and stable Respiratory status: spontaneous breathing, nonlabored ventilation, respiratory function stable and patient connected to nasal cannula oxygen Cardiovascular status: blood pressure returned to baseline and stable Postop Assessment: no apparent nausea or vomiting Anesthetic complications: no     Last Vitals:  Vitals:   03/07/18 1012 03/07/18 1022  BP: 98/64 96/62  Pulse: 71 65  Resp: 16 14  Temp:    SpO2: 99% 100%    Last Pain:  Vitals:   03/07/18 1022  TempSrc:   PainSc: 0-No pain                 Precious Haws Piscitello

## 2018-03-07 NOTE — Anesthesia Preprocedure Evaluation (Signed)
Anesthesia Evaluation  Patient identified by MRN, date of birth, ID band Patient awake    Reviewed: Allergy & Precautions, H&P , NPO status , Patient's Chart, lab work & pertinent test results  History of Anesthesia Complications Negative for: history of anesthetic complications  Airway Mallampati: III  TM Distance: <3 FB Neck ROM: full    Dental  (+) Chipped   Pulmonary neg shortness of breath, former smoker,  Signs and symptoms suggestive of sleep apnea            Cardiovascular Exercise Tolerance: Good (-) angina(-) Past MI and (-) DOE negative cardio ROS       Neuro/Psych negative neurological ROS  negative psych ROS   GI/Hepatic Neg liver ROS, GERD  Medicated and Controlled,  Endo/Other  negative endocrine ROS  Renal/GU negative Renal ROS  negative genitourinary   Musculoskeletal   Abdominal   Peds  Hematology negative hematology ROS (+)   Anesthesia Other Findings Past Medical History: No date: Esophageal reflux No date: Neck pain on left side     Comment:  CAUSED BY CYST AND CAUSES SHOULDER PAIN;  DR. Sharlet Salina  Past Surgical History: No date: ANKLE SURGERY; Left No date: BACK SURGERY No date: SPINAL FUSION No date: WISDOM TOOTH EXTRACTION     Comment:  TWO  BMI    Body Mass Index:  29.12 kg/m      Reproductive/Obstetrics negative OB ROS                             Anesthesia Physical Anesthesia Plan  ASA: III  Anesthesia Plan: General   Post-op Pain Management:    Induction: Intravenous  PONV Risk Score and Plan: Propofol infusion and TIVA  Airway Management Planned: Natural Airway and Nasal Cannula  Additional Equipment:   Intra-op Plan:   Post-operative Plan:   Informed Consent: I have reviewed the patients History and Physical, chart, labs and discussed the procedure including the risks, benefits and alternatives for the proposed anesthesia with the  patient or authorized representative who has indicated his/her understanding and acceptance.   Dental Advisory Given  Plan Discussed with: Anesthesiologist, CRNA and Surgeon  Anesthesia Plan Comments: (Patient consented for risks of anesthesia including but not limited to:  - adverse reactions to medications - risk of intubation if required - damage to teeth, lips or other oral mucosa - sore throat or hoarseness - Damage to heart, brain, lungs or loss of life  Patient voiced understanding.)        Anesthesia Quick Evaluation

## 2018-03-07 NOTE — Transfer of Care (Signed)
Immediate Anesthesia Transfer of Care Note  Patient: Alicia Woods  Procedure(s) Performed: COLONOSCOPY WITH PROPOFOL (N/A )  Patient Location: Endoscopy Unit  Anesthesia Type:General  Level of Consciousness: drowsy  Airway & Oxygen Therapy: Patient Spontanous Breathing  Post-op Assessment: Report given to RN and Post -op Vital signs reviewed and stable  Post vital signs: Reviewed and stable  Last Vitals:  Vitals Value Taken Time  BP 89/65 03/07/2018  9:51 AM  Temp    Pulse 79 03/07/2018  9:51 AM  Resp 14 03/07/2018  9:51 AM  SpO2 97 % 03/07/2018  9:51 AM  Vitals shown include unvalidated device data.  Last Pain:  Vitals:   03/07/18 0810  TempSrc: Tympanic  PainSc: 0-No pain         Complications: No apparent anesthesia complications

## 2018-03-07 NOTE — Anesthesia Post-op Follow-up Note (Signed)
Anesthesia QCDR form completed.        

## 2018-03-07 NOTE — H&P (Signed)
Cephas Darby, MD [address]  [place]  [place], [place] [address]  Main: [phone]  Fax: [phone] Pager: [phone]  Primary Care Physician:  Maryland Pink, MD Primary Gastroenterologist:  Dr. Cephas Darby  Pre-Procedure History & Physical: HPI:  Alicia Woods is a 52 y.o. female is here for an colonoscopy.   Past Medical History:  Diagnosis Date  . Esophageal reflux   . Neck pain on left side    CAUSED BY CYST AND CAUSES SHOULDER PAIN;  DR. Sharlet Salina    Past Surgical History:  Procedure Laterality Date  . ANKLE SURGERY Left   . BACK SURGERY    . SPINAL FUSION    . WISDOM TOOTH EXTRACTION     TWO    Prior to Admission medications   Medication Sig Start Date End Date Taking? Authorizing Provider  Calcium Citrate 250 MG TABS Take 1 capsule daily by mouth.   Yes [provider]  Cholecalciferol (VITAMIN D3) 2000 units capsule Take 1 capsule daily by mouth.   Yes [provider]  diphenhydrAMINE (BENADRYL) 25 mg capsule Take 1-2 tabs PO QHS 02/07/18  Yes [provider]  gabapentin (NEURONTIN) 300 MG capsule Take 1 capsule daily by mouth. 11/26/16  Yes [provider]  Norethindrone-Ethinyl Estradiol-Fe Biphas (LO LOESTRIN FE) 1 MG-10 MCG / 10 MCG tablet Take 1 tablet by mouth daily. 96/29/52  Yes Copland, Deirdre Evener, PA-C  omeprazole (PRILOSEC) 20 MG capsule Take 1 capsule daily by mouth. 02/08/17  Yes [provider]  Potassium 99 MG TABS Take 1 tablet daily by mouth.   Yes [provider]  VITAMIN B COMPLEX-C CAPS Take 1 capsule daily by mouth.   Yes [provider]    Allergies as of 02/17/2018 - Review Complete 02/16/2018  Allergen Reaction Noted  . Ciprofloxacin Hives   . Ciprocinonide [fluocinolone] Itching 01/31/2013  . Robitussin (alcohol free) [guaifenesin] Swelling 01/31/2013  . Penicillins Rash 01/31/2013    Family History  Problem Relation Age of Onset  . Diabetes Mother        Type 1  . Breast cancer Maternal Grandmother        age unknown  . Other Father        Polio  . Muscular dystrophy Sister     Social History   Socioeconomic History  . Marital status: Married    Spouse name: Not on file  . Number of children: Not on file  . Years of education: Not on file  . Highest education level: Not on file  Occupational History  . Not on file  Social Needs  . Financial resource strain: Not on file  . Food insecurity:    Worry: Not on file    Inability: Not on file  . Transportation needs:    Medical: Not on file    Non-medical: Not on file  Tobacco Use  . Smoking status: Former Research scientist (life sciences)  . Smokeless tobacco: Never Used  . Tobacco comment: quit 2009  Substance and Sexual Activity  . Alcohol use: Yes    Comment: social   . Drug use: No  . Sexual activity: Yes    Birth control/protection: Post-menopausal  Lifestyle  . Physical activity:    Days per week: Not on file    Minutes per session: Not on file  . Stress: Not on file  Relationships  . Social connections:    Talks on phone: Not on file    Gets together: Not on  file    Attends religious service: Not on file    Active member of club or organization: Not on file    Attends meetings of clubs or organizations: Not on file    Relationship status: Not on file  . Intimate partner violence:    Fear of current or ex partner: Not on file    Emotionally abused: Not on file    Physically abused: Not on file    Forced sexual activity: Not on file  Other Topics Concern  . Not on file  Social History Narrative  . Not on file    Review of Systems: See HPI, otherwise negative ROS  Physical Exam: BP 106/82   Pulse 82   Temp (!) 96.4 F (35.8 C) (Tympanic)   Resp 18   Ht 5\' 5"  (1.651 m)   Wt 79.4 kg   SpO2 99%   BMI 29.12 kg/m  General:   Alert,  pleasant and cooperative in NAD Head:  Normocephalic and atraumatic. Neck:  Supple; no masses or thyromegaly. Lungs:  Clear throughout to  auscultation.    Heart:  Regular rate and rhythm. Abdomen:  Soft, nontender and nondistended. Normal bowel sounds, without guarding, and without rebound.   Neurologic:  Alert and  oriented x4;  grossly normal neurologically.  Impression/Plan: Alicia Woods is here for an colonoscopy to be performed for colon cancer screening  Risks, benefits, limitations, and alternatives regarding  colonoscopy have been reviewed with the patient.  Questions have been answered.  All parties agreeable.   Sherri Sear, MD  03/07/2018, 8:30 AM

## 2018-03-09 LAB — SURGICAL PATHOLOGY

## 2018-03-14 ENCOUNTER — Encounter: Payer: Self-pay | Admitting: Gastroenterology

## 2018-07-18 IMAGING — MG MM DIGITAL SCREENING BILAT W/ CAD
6 series · 6 of 6 positions shown · non-contrast
Comparison: Previous exam(s).

CLINICAL DATA: Screening.

EXAM:
DIGITAL SCREENING BILATERAL MAMMOGRAM WITH CAD

[L MLO (1 of 2)]
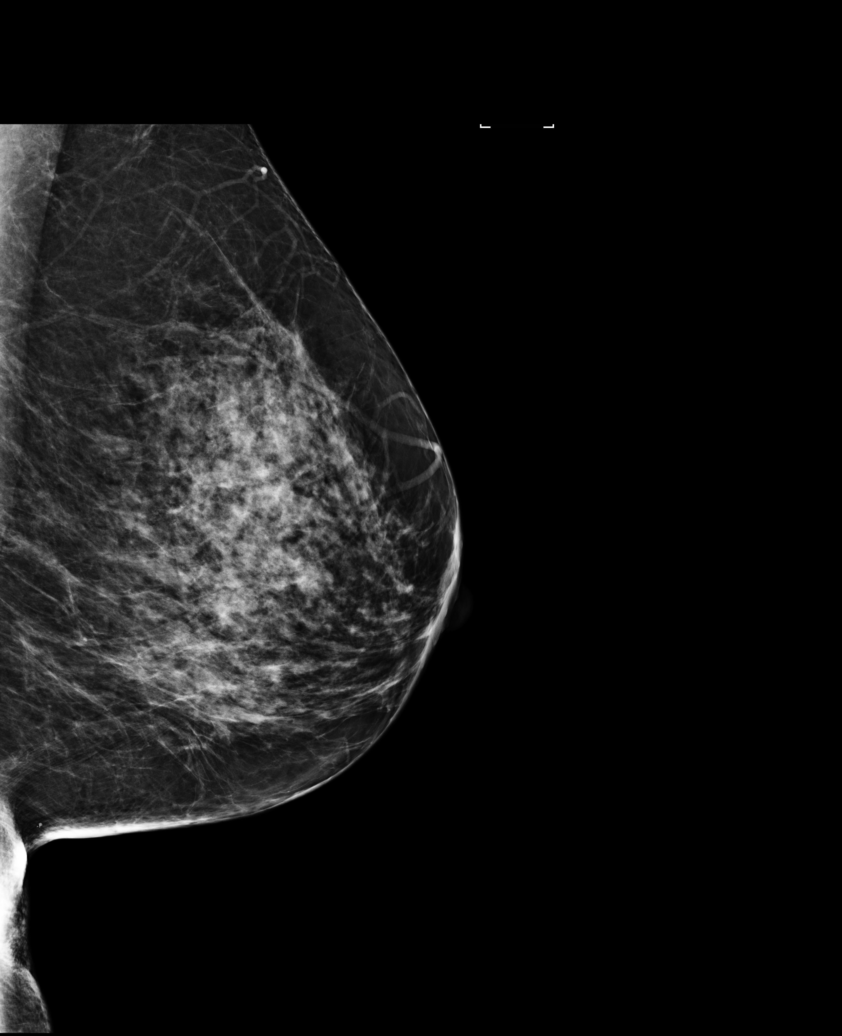

[L CC]
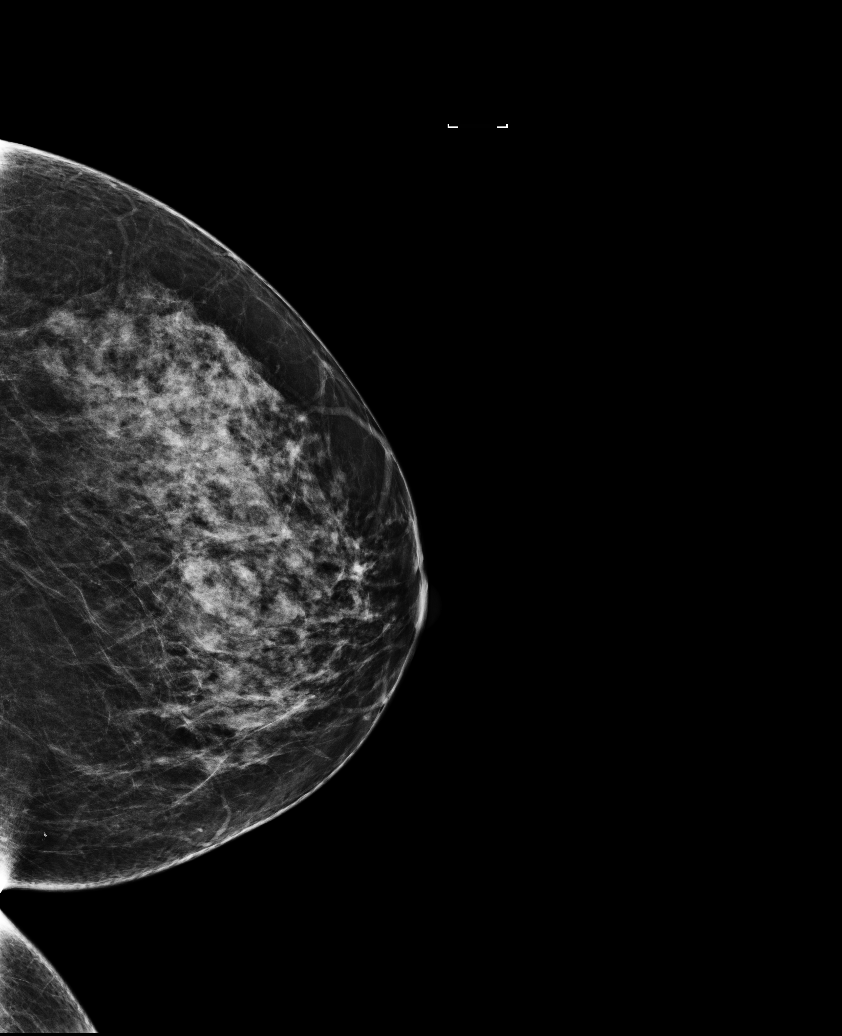

[R CC (1 of 2)]
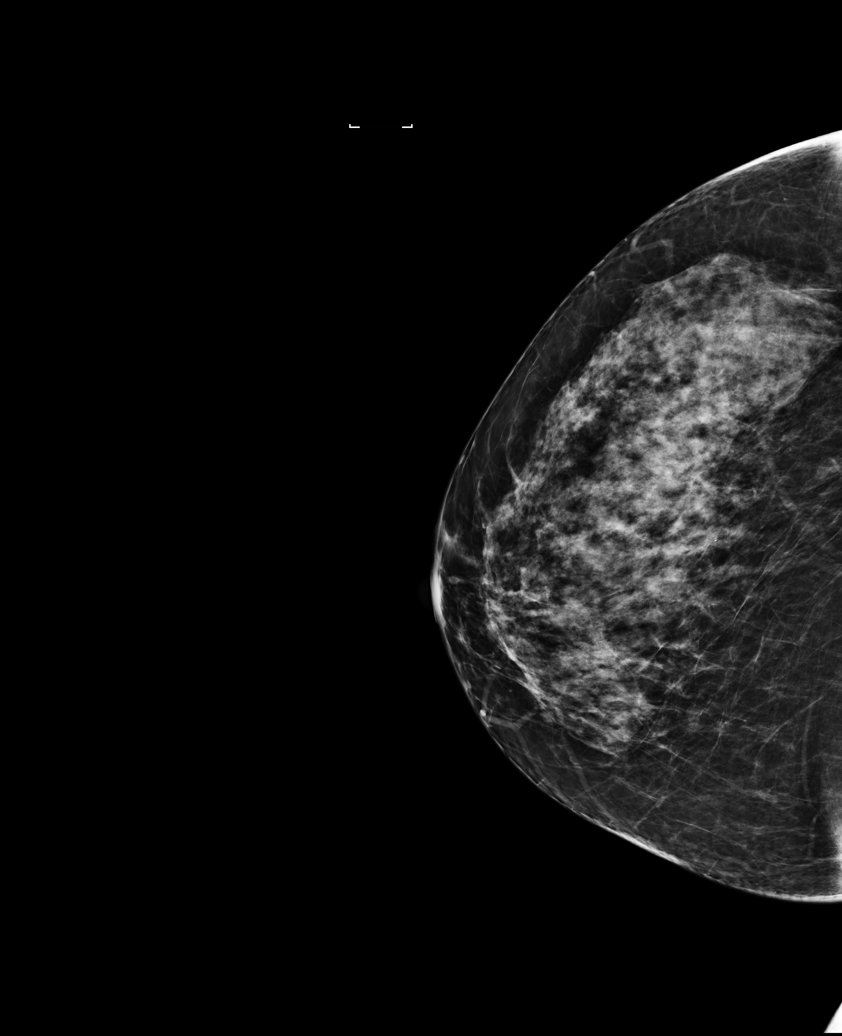

[R MLO]
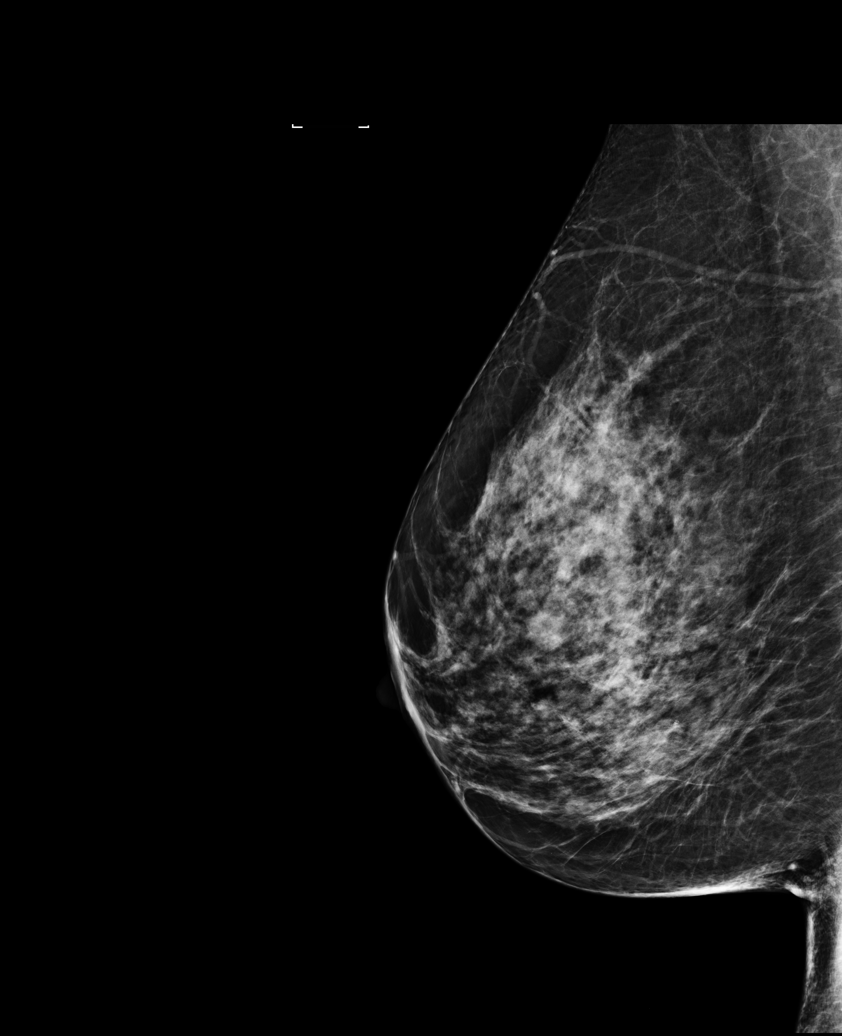

[L MLO (2 of 2)]
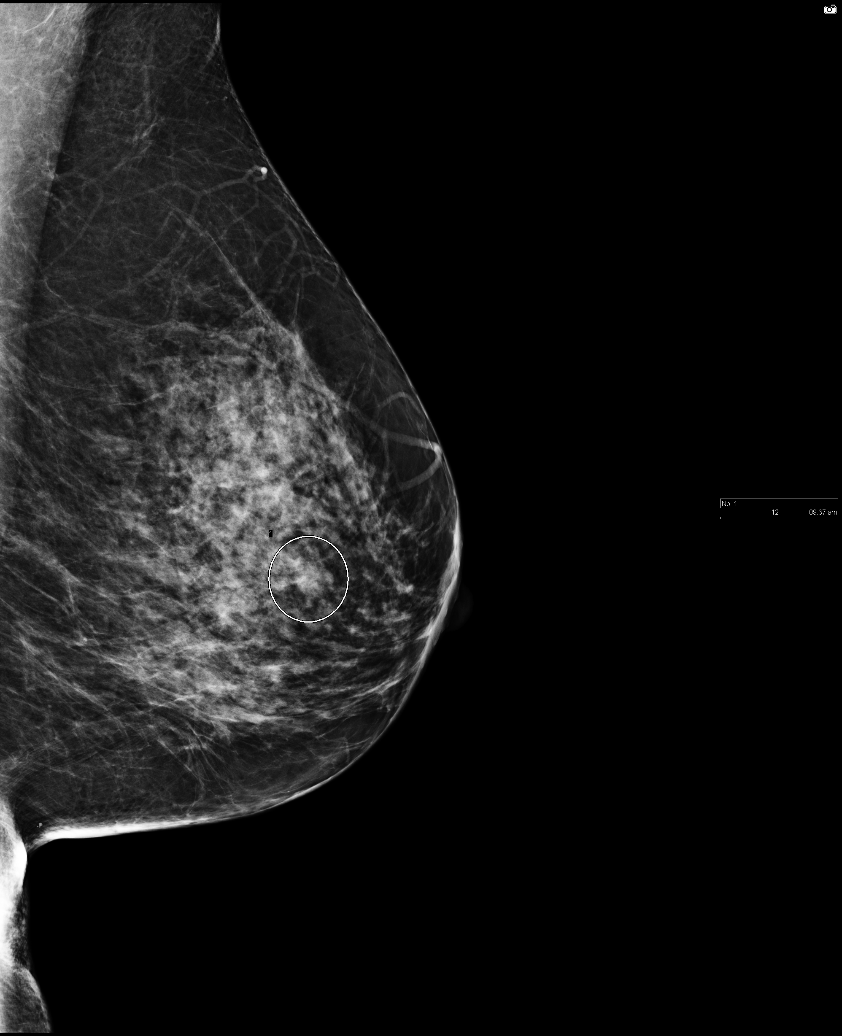

[R CC (2 of 2)]
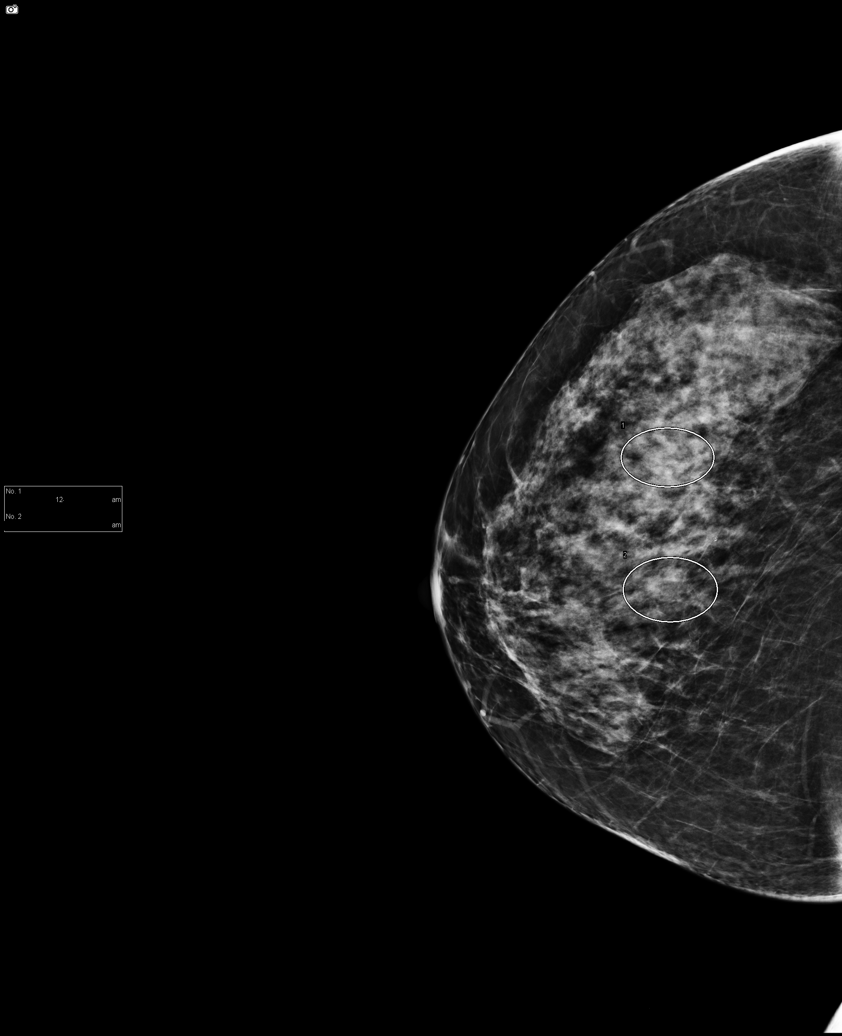

[6 of 6 positions shown; findings below may reference images not displayed]

ACR Breast Density Category c: The breast tissue is heterogeneously
dense, which may obscure small masses.
FINDINGS: In the right breast , possible masses require further evaluation.

In the left breast , a possible mass requires further evaluation.

Images were processed with CAD.
IMPRESSION: Further evaluation is suggested for possible masses in the right
breast.

Further evaluation is suggested for possible mass in the left
breast.

RECOMMENDATION:
Diagnostic mammogram and possibly ultrasound of both breasts.
(Code:69-S-IIM)

The patient will be contacted regarding the findings, and additional
imaging will be scheduled.

BI-RADS CATEGORY  0: Incomplete. Need additional imaging evaluation
and/or prior mammograms for comparison.

## 2018-11-29 ENCOUNTER — Other Ambulatory Visit: Payer: Self-pay

## 2018-11-29 DIAGNOSIS — Z3041 Encounter for surveillance of contraceptive pills: Secondary | ICD-10-CM

## 2018-11-29 MED ORDER — NORETHIN-ETH ESTRAD-FE BIPHAS 1 MG-10 MCG / 10 MCG PO TABS: 1.0000 | ORAL_TABLET | Freq: Every day | ORAL | 3 refills | Status: DC

## 2018-11-29 NOTE — Telephone Encounter (Signed)
Pt has annual in November, needed refill on OCP to last. Sent to [place]. Pt aware

## 2018-12-26 ENCOUNTER — Other Ambulatory Visit: Payer: Self-pay | Admitting: Family Medicine

## 2018-12-26 DIAGNOSIS — Z1231 Encounter for screening mammogram for malignant neoplasm of breast: Secondary | ICD-10-CM

## 2019-01-03 ENCOUNTER — Ambulatory Visit
Admission: RE | Admit: 2019-01-03 | Discharge: 2019-01-03 | Disposition: A | Discharge status: Home / Self Care | Payer: PRIVATE HEALTH INSURANCE | Source: Ambulatory Visit | Attending: Family Medicine | Admitting: Family Medicine

## 2019-01-03 DIAGNOSIS — Z1231 Encounter for screening mammogram for malignant neoplasm of breast: Secondary | ICD-10-CM | POA: Insufficient documentation

## 2019-02-26 NOTE — Progress Notes (Deleted)
PCP:  Maryland Pink, MD   No chief complaint on file.    HPI:      [name] is a 53 y.o. No obstetric history on file. who LMP was No LMP recorded. Patient is postmenopausal., presents today for her annual examination.  Her menses are absent with cont dosing Lo Loestrin OCPs. She tried doing placebo pills this yr and had withdrawal bleed.  Dysmenorrhea none. She does not have intermenstrual bleeding.  Sex activity: single partner, contraception - OCP (estrogen/progesterone).  Last Pap: 02/15/17  Results were: no abnormalities /neg HPV DNA  Hx of STDs: none  Last mammogram: 01/03/19  Results were: normal--routine follow-up in 12 months There is a FH of breast cancer in her MGM, age unkown. There is no FH of ovarian cancer. The patient does not do self-breast exams.  Tobacco use: The patient denies current or previous tobacco use. Alcohol use: none No drug use.  Exercise: not active  Colonoscopy: 12/19 Results were  Repeat due after 10 yrs?  She does get adequate calcium and Vitamin D in her diet.  Labs with PCP.   Past Medical History:  Diagnosis Date  . Esophageal reflux   . Neck pain on left side    CAUSED BY CYST AND CAUSES SHOULDER PAIN;  DR. Sharlet Salina    Past Surgical History:  Procedure Laterality Date  . ANKLE SURGERY Left   . BACK SURGERY    . COLONOSCOPY WITH PROPOFOL N/A 03/07/2018   Procedure: COLONOSCOPY WITH PROPOFOL;  Surgeon: Lin Landsman, MD;  Location: [facility] ENDOSCOPY;  Service: Gastroenterology;  Laterality: N/A;  . SPINAL FUSION    . WISDOM TOOTH EXTRACTION     TWO    Family History  Problem Relation Age of Onset  . Diabetes Mother        Type 1  . Breast cancer Maternal Grandmother        age unknown  . Other Father        Polio  . Muscular dystrophy Sister     Social History   Socioeconomic History  . Marital status: Married    Spouse name: Not on file  . Number of children: Not on file  . Years of education: Not on  file  . Highest education level: Not on file  Occupational History  . Not on file  Social Needs  . Financial resource strain: Not on file  . Food insecurity    Worry: Not on file    Inability: Not on file  . Transportation needs    Medical: Not on file    Non-medical: Not on file  Tobacco Use  . Smoking status: Former Research scientist (life sciences)  . Smokeless tobacco: Never Used  . Tobacco comment: quit 2009  Substance and Sexual Activity  . Alcohol use: Yes    Comment: social   . Drug use: No  . Sexual activity: Yes    Birth control/protection: Post-menopausal  Lifestyle  . Physical activity    Days per week: Not on file    Minutes per session: Not on file  . Stress: Not on file  Relationships  . Social Herbalist on phone: Not on file    Gets together: Not on file    Attends religious service: Not on file    Active member of club or organization: Not on file    Attends meetings of clubs or organizations: Not on file    Relationship status: Not on file  .  Intimate partner violence    Fear of current or ex partner: Not on file    Emotionally abused: Not on file    Physically abused: Not on file    Forced sexual activity: Not on file  Other Topics Concern  . Not on file  Social History Narrative  . Not on file    No outpatient medications have been marked as taking for the 02/27/19 encounter (Appointment) with Alisan Dokes, Deirdre Evener, PA-C.     ROS:  Review of Systems  Constitutional: Negative for fatigue, fever and unexpected weight change.  Respiratory: Negative for cough, shortness of breath and wheezing.   Cardiovascular: Negative for chest pain, palpitations and leg swelling.  Gastrointestinal: Negative for blood in stool, constipation, diarrhea, nausea and vomiting.  Endocrine: Negative for cold intolerance, heat intolerance and polyuria.  Genitourinary: Negative for dyspareunia, dysuria, flank pain, frequency, genital sores, hematuria, menstrual problem, pelvic pain,  urgency, vaginal bleeding, vaginal discharge and vaginal pain.  Musculoskeletal: Negative for back pain, joint swelling and myalgias.  Skin: Negative for rash.  Neurological: Negative for dizziness, syncope, light-headedness, numbness and headaches.  Hematological: Negative for adenopathy.  Psychiatric/Behavioral: Negative for agitation, confusion, sleep disturbance and suicidal ideas. The patient is not nervous/anxious.      Objective: There were no vitals taken for this visit.   Physical Exam Constitutional:      Appearance: She is well-developed.  Genitourinary:     Vagina and uterus normal.     No vaginal discharge, erythema or tenderness.     No cervical motion tenderness or polyp.     Uterus is not enlarged or tender.     No right or left adnexal mass present.     Right adnexa not tender.     Left adnexa not tender.  Neck:     Musculoskeletal: Normal range of motion.     Thyroid: No thyromegaly.  Cardiovascular:     Rate and Rhythm: Normal rate and regular rhythm.     Heart sounds: Normal heart sounds. No murmur.  Pulmonary:     Effort: Pulmonary effort is normal.     Breath sounds: Normal breath sounds.  Chest:     Breasts:        Right: No mass, nipple discharge, skin change or tenderness.        Left: No mass, nipple discharge, skin change or tenderness.  Abdominal:     Palpations: Abdomen is soft.     Tenderness: There is no abdominal tenderness. There is no guarding.  Musculoskeletal: Normal range of motion.  Neurological:     Mental Status: She is alert and oriented to person, place, and time.     Cranial Nerves: No cranial nerve deficit.  Psychiatric:        Behavior: Behavior normal.  Vitals signs reviewed.     Assessment/Plan: No diagnosis found.            GYN counsel breast self exam, mammography screening, use and side effects of OCP's, adequate intake of calcium and vitamin D, diet and exercise     F/U  No follow-ups on file.  Brazil Voytko B.  Eliam Snapp, PA-C 02/26/2019 5:04 PM

## 2019-02-27 ENCOUNTER — Ambulatory Visit: Payer: PRIVATE HEALTH INSURANCE | Admitting: Obstetrics and Gynecology

## 2019-04-17 ENCOUNTER — Ambulatory Visit: Payer: PRIVATE HEALTH INSURANCE | Admitting: Obstetrics and Gynecology

## 2019-04-21 ENCOUNTER — Ambulatory Visit (INDEPENDENT_AMBULATORY_CARE_PROVIDER_SITE_OTHER): Payer: PRIVATE HEALTH INSURANCE | Admitting: Obstetrics and Gynecology

## 2019-04-21 ENCOUNTER — Encounter: Payer: Self-pay | Admitting: Obstetrics and Gynecology

## 2019-04-21 ENCOUNTER — Other Ambulatory Visit: Payer: Self-pay

## 2019-04-21 VITALS — BP 100/70 | Ht 65.0 in | Wt 186.0 lb

## 2019-04-21 DIAGNOSIS — Z01419 Encounter for gynecological examination (general) (routine) without abnormal findings: Secondary | ICD-10-CM

## 2019-04-21 DIAGNOSIS — Z1231 Encounter for screening mammogram for malignant neoplasm of breast: Secondary | ICD-10-CM

## 2019-04-21 DIAGNOSIS — Z3041 Encounter for surveillance of contraceptive pills: Secondary | ICD-10-CM

## 2019-04-21 MED ORDER — NORETHIN-ETH ESTRAD-FE BIPHAS 1 MG-10 MCG / 10 MCG PO TABS: 1.0000 | ORAL_TABLET | Freq: Every day | ORAL | 3 refills | Status: DC

## 2019-04-21 NOTE — Patient Instructions (Signed)
I value your feedback and entrusting us with your care. If you get a [facility] patient survey, I would appreciate you taking the time to let us know about your experience today. Thank you!  As of March 09, 2019, your lab results will be released to your MyChart immediately, before I even have a chance to see them. Please give me time to review them and contact you if there are any abnormalities. Thank you for your patience.  

## 2019-04-21 NOTE — Progress Notes (Signed)
PCP:  Maryland Pink, MD   Chief Complaint  Patient presents with  . Gynecologic Exam     HPI:      [name] is a 54 y.o. No obstetric history on file. who LMP was No LMP recorded. Patient is postmenopausal., presents today for her annual examination.  Her menses are absent with cont dosing Lo Loestrin OCPs. She tried doing placebo pills 2019 and had withdrawal bleed.  Dysmenorrhea none. She does not have intermenstrual bleeding.  Sex activity: single partner, contraception - OCP (estrogen/progesterone).  Last Pap: 02/15/17  Results were: no abnormalities /neg HPV DNA  Hx of STDs: none  Last mammogram: 01/03/19  Results were: normal--routine follow-up in 12 months There is a FH of breast cancer in her MGM, age unkown. There is no FH of ovarian cancer. The patient does not do self-breast exams.  Tobacco use: The patient denies current or previous tobacco use. Alcohol use: none No drug use.  Exercise: not active  Colonoscopy: 2019 with Dr. Marius Ditch,  with polyp; repeat after 10 yrs.  She does get adequate calcium and Vitamin D in her diet.  Labs with PCP.   Past Medical History:  Diagnosis Date  . Esophageal reflux   . Neck pain on left side    CAUSED BY CYST AND CAUSES SHOULDER PAIN;  DR. Sharlet Salina    Past Surgical History:  Procedure Laterality Date  . ANKLE SURGERY Left   . BACK SURGERY    . COLONOSCOPY WITH PROPOFOL N/A 03/07/2018   Procedure: COLONOSCOPY WITH PROPOFOL;  Surgeon: Lin Landsman, MD;  Location: [facility] ENDOSCOPY;  Service: Gastroenterology;  Laterality: N/A;  . SPINAL FUSION    . WISDOM TOOTH EXTRACTION     TWO    Family History  Problem Relation Age of Onset  . Diabetes Mother        Type 1  . Breast cancer Maternal Grandmother        age unknown  . Other Father        Polio  . Muscular dystrophy Sister     Social History   Socioeconomic History  . Marital status: Married    Spouse name: Not on file  . Number of children:  Not on file  . Years of education: Not on file  . Highest education level: Not on file  Occupational History  . Not on file  Tobacco Use  . Smoking status: Former Research scientist (life sciences)  . Smokeless tobacco: Never Used  . Tobacco comment: quit 2009  Substance and Sexual Activity  . Alcohol use: Yes    Comment: social   . Drug use: No  . Sexual activity: Yes    Birth control/protection: Post-menopausal  Other Topics Concern  . Not on file  Social History Narrative  . Not on file   Social Determinants of Health   Financial Resource Strain:   . Difficulty of Paying Living Expenses: Not on file  Food Insecurity:   . Worried About Charity fundraiser in the Last Year: Not on file  . Ran Out of Food in the Last Year: Not on file  Transportation Needs:   . Lack of Transportation (Medical): Not on file  . Lack of Transportation (Non-Medical): Not on file  Physical Activity:   . Days of Exercise per Week: Not on file  . Minutes of Exercise per Session: Not on file  Stress:   . Feeling of Stress : Not on file  Social Connections:   .  Frequency of Communication with Friends and Family: Not on file  . Frequency of Social Gatherings with Friends and Family: Not on file  . Attends Religious Services: Not on file  . Active Member of Clubs or Organizations: Not on file  . Attends Archivist Meetings: Not on file  . Marital Status: Not on file  Intimate Partner Violence:   . Fear of Current or Ex-Partner: Not on file  . Emotionally Abused: Not on file  . Physically Abused: Not on file  . Sexually Abused: Not on file    Current Meds  Medication Sig  . Calcium Citrate 250 MG TABS Take 1 capsule daily by mouth.  . Cholecalciferol (VITAMIN D3) 2000 units capsule Take 1 capsule daily by mouth.  . diphenhydrAMINE (BENADRYL) 25 mg capsule Take 1-2 tabs PO QHS  . fluticasone (FLONASE) 50 MCG/ACT nasal spray Place into the nose.  . gabapentin (NEURONTIN) 300 MG capsule Take 1 capsule daily by  mouth.  . loratadine (CLARITIN) 10 MG tablet Take by mouth.  . Norethindrone-Ethinyl Estradiol-Fe Biphas (LO LOESTRIN FE) 1 MG-10 MCG / 10 MCG tablet Take 1 tablet by mouth daily.  Marland Kitchen omeprazole (PRILOSEC) 20 MG capsule Take 1 capsule daily by mouth.  . Potassium 99 MG TABS Take 1 tablet daily by mouth.  Marland Kitchen VITAMIN B COMPLEX-C CAPS Take 1 capsule daily by mouth.  . [DISCONTINUED] Norethindrone-Ethinyl Estradiol-Fe Biphas (LO LOESTRIN FE) 1 MG-10 MCG / 10 MCG tablet Take 1 tablet by mouth daily.     ROS:  Review of Systems  Constitutional: Negative for fatigue, fever and unexpected weight change.  Respiratory: Negative for cough, shortness of breath and wheezing.   Cardiovascular: Negative for chest pain, palpitations and leg swelling.  Gastrointestinal: Negative for blood in stool, constipation, diarrhea, nausea and vomiting.  Endocrine: Negative for cold intolerance, heat intolerance and polyuria.  Genitourinary: Negative for dyspareunia, dysuria, flank pain, frequency, genital sores, hematuria, menstrual problem, pelvic pain, urgency, vaginal bleeding, vaginal discharge and vaginal pain.  Musculoskeletal: Negative for back pain, joint swelling and myalgias.  Skin: Negative for rash.  Neurological: Negative for dizziness, syncope, light-headedness, numbness and headaches.  Hematological: Negative for adenopathy.  Psychiatric/Behavioral: Negative for agitation, confusion, sleep disturbance and suicidal ideas. The patient is not nervous/anxious.      Objective: BP 100/70   Ht 5\' 5"  (1.651 m)   Wt 186 lb (84.4 kg)   BMI 30.95 kg/m    Physical Exam Constitutional:      Appearance: She is well-developed.  Genitourinary:     Vulva, vagina, uterus, right adnexa and left adnexa normal.     No vulval lesion or tenderness noted.     No vaginal discharge, erythema or tenderness.     No cervical motion tenderness or polyp.     Uterus is not enlarged or tender.     No right or left  adnexal mass present.     Right adnexa not tender.     Left adnexa not tender.  Neck:     Thyroid: No thyromegaly.  Cardiovascular:     Rate and Rhythm: Normal rate and regular rhythm.     Heart sounds: Normal heart sounds. No murmur.  Pulmonary:     Effort: Pulmonary effort is normal.     Breath sounds: Normal breath sounds.  Chest:     Breasts:        Right: No mass, nipple discharge, skin change or tenderness.        Left:  No mass, nipple discharge, skin change or tenderness.  Abdominal:     Palpations: Abdomen is soft.     Tenderness: There is no abdominal tenderness. There is no guarding.  Musculoskeletal:        General: Normal range of motion.     Cervical back: Normal range of motion.  Neurological:     General: No focal deficit present.     Mental Status: She is alert and oriented to person, place, and time.     Cranial Nerves: No cranial nerve deficit.  Skin:    General: Skin is warm and dry.  Psychiatric:        Mood and Affect: Mood normal.        Behavior: Behavior normal.        Thought Content: Thought content normal.        Judgment: Judgment normal.  Vitals reviewed.     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 Re-eval need for them next yr due to age. - Plan: Norethindrone-Ethinyl Estradiol-Fe Biphas (LO LOESTRIN FE) 1 MG-10 MCG / 10 MCG tablet  Meds ordered this encounter  Medications  . Norethindrone-Ethinyl Estradiol-Fe Biphas (LO LOESTRIN FE) 1 MG-10 MCG / 10 MCG tablet    Sig: Take 1 tablet by mouth daily.    Dispense:  84 tablet    Refill:  3    Order Specific Question:   Supervising Provider    Answer:   Gae Dry [id number]    GYN counsel breast self exam, mammography screening, adequate intake of  calcium and vitamin D, diet and exercise     F/U  Return in about 1 year (around 04/20/2020).  Gevon Markus B. Mirabella Hilario, PA-C 04/21/2019 4:31 PM

## 2019-12-18 ENCOUNTER — Ambulatory Visit: Payer: PRIVATE HEALTH INSURANCE | Admitting: Podiatry

## 2019-12-18 ENCOUNTER — Ambulatory Visit: Payer: PRIVATE HEALTH INSURANCE

## 2019-12-18 ENCOUNTER — Other Ambulatory Visit: Payer: Self-pay

## 2019-12-18 DIAGNOSIS — E785 Hyperlipidemia, unspecified: Secondary | ICD-10-CM | POA: Insufficient documentation

## 2019-12-18 DIAGNOSIS — M48061 Spinal stenosis, lumbar region without neurogenic claudication: Secondary | ICD-10-CM | POA: Insufficient documentation

## 2019-12-18 DIAGNOSIS — S82899A Other fracture of unspecified lower leg, initial encounter for closed fracture: Secondary | ICD-10-CM | POA: Insufficient documentation

## 2019-12-28 ENCOUNTER — Ambulatory Visit (INDEPENDENT_AMBULATORY_CARE_PROVIDER_SITE_OTHER): Payer: PRIVATE HEALTH INSURANCE

## 2019-12-28 ENCOUNTER — Other Ambulatory Visit: Payer: Self-pay

## 2019-12-28 ENCOUNTER — Other Ambulatory Visit: Payer: Self-pay | Admitting: Podiatry

## 2019-12-28 ENCOUNTER — Ambulatory Visit: Payer: PRIVATE HEALTH INSURANCE | Admitting: Podiatry

## 2019-12-28 ENCOUNTER — Encounter: Payer: Self-pay | Admitting: Podiatry

## 2019-12-28 DIAGNOSIS — M7751 Other enthesopathy of right foot: Secondary | ICD-10-CM

## 2019-12-28 DIAGNOSIS — M19172 Post-traumatic osteoarthritis, left ankle and foot: Secondary | ICD-10-CM | POA: Diagnosis not present

## 2019-12-28 DIAGNOSIS — M5126 Other intervertebral disc displacement, lumbar region: Secondary | ICD-10-CM | POA: Insufficient documentation

## 2019-12-28 NOTE — Progress Notes (Signed)
Subjective:  Patient ID: Alicia Woods, female    DOB: May 31, 1965,  MRN: [id number]  Chief Complaint  Patient presents with  . Foot Pain    Patient presents today for right top of foot pain x 1 month and left ankle pain from previous fracture in 2002  . Ankle Pain    54 y.o. female presents with the above complaint.  Patient presents with a complaint of left ankle pain as well as right second metatarsophalangeal joint pain.  Patient states that has been going on for 1 month on the right side and has been not been the same since 2002 on the left side.  Patient states that she has a lot of pain when ambulating.  She states is burning sensation to the right side.  Pain scale 6 out of 10.  Is dull achy in nature to the left side.  She denies any other acute complaints she has not seen anyone else prior to seeing me.  She would like to discuss treatment options.   Review of Systems: Negative except as noted in the HPI. Denies N/V/F/Ch.  Past Medical History:  Diagnosis Date  . Esophageal reflux   . Neck pain on left side    CAUSED BY CYST AND CAUSES SHOULDER PAIN;  DR. Sharlet Salina    Current Outpatient Medications:  .  Calcium Citrate 250 MG TABS, Take 1 capsule daily by mouth., Disp: , Rfl:  .  Cholecalciferol (VITAMIN D3) 2000 units capsule, Take 1 capsule daily by mouth., Disp: , Rfl:  .  cyclobenzaprine (FLEXERIL) 10 MG tablet, 1/2-1 po qHS prn, Disp: , Rfl:  .  diphenhydrAMINE (BENADRYL) 25 mg capsule, Take 1-2 tabs PO QHS, Disp: , Rfl:  .  fluticasone (FLONASE) 50 MCG/ACT nasal spray, Place into the nose., Disp: , Rfl:  .  gabapentin (NEURONTIN) 300 MG capsule, Take 1 capsule daily by mouth., Disp: , Rfl:  .  loratadine (CLARITIN) 10 MG tablet, Take by mouth., Disp: , Rfl:  .  meloxicam (MOBIC) 15 MG tablet, Take 15 mg by mouth daily., Disp: , Rfl:  .  Norethindrone-Ethinyl Estradiol-Fe Biphas (LO LOESTRIN FE) 1 MG-10 MCG / 10 MCG tablet, Take 1 tablet by mouth daily., Disp: 84 tablet,  Rfl: 3 .  omeprazole (PRILOSEC) 20 MG capsule, Take 1 capsule daily by mouth., Disp: , Rfl:  .  Potassium 99 MG TABS, Take 1 tablet daily by mouth., Disp: , Rfl:  .  VITAMIN B COMPLEX-C CAPS, Take 1 capsule daily by mouth., Disp: , Rfl:   Social History   Tobacco Use  Smoking Status Former Smoker  Smokeless Tobacco Never Used  Tobacco Comment   quit 2009    Allergies  Allergen Reactions  . Ciprofloxacin Hives  . Ciprocinonide [Fluocinolone] Itching  . Robitussin (Alcohol Free) [Guaifenesin] Swelling  . Penicillins Rash   Objective:  There were no vitals filed for this visit. There is no height or weight on file to calculate BMI. Constitutional Well developed. Well nourished.  Vascular Dorsalis pedis pulses palpable bilaterally. Posterior tibial pulses palpable bilaterally. Capillary refill normal to all digits.  No cyanosis or clubbing noted. Pedal hair growth normal.  Neurologic Normal speech. Oriented to person, place, and time. Epicritic sensation to light touch grossly present bilaterally.  Dermatologic Nails well groomed and normal in appearance. No open wounds. No skin lesions.  Orthopedic:  Pain on palpation the medial lateral ankle gutter to the left side.  Pain with range of motion of the ankle joint.  Stiffness noted to the ankle joint.  Intra-articular pain to the left ankle noted.  Mild crepitus clinically appreciated.  Right second metatarsophalangeal joint pain range of motion with pain at second metatarsophalangeal joint.  Positive Mulder's click. Subjective finding of numbness to first and second toes facing sides.   Radiographs: 3 views of skeletally mature adult right foot left ankle: Severe arthritic changes noted to the left ankle joint with decrease in joint space.  No loose osteophytes noted.  History of previous fractures noted to the left side.  No bony abnormalities noted to the right side.  Negative Sullivan sign.  Mild hammertoe contractures  noted. Assessment:   1. Capsulitis of metatarsophalangeal (MTP) joint of right foot   2. 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  Right ankle degenerative joint disease -I explained to the patient the etiology of ankle degenerative joint disease and various treatment options were discussed.  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  I believe patient will benefit from a steroid injection to help decrease the acute inflammatory component associated pain.  Patient agrees with the plan would like to proceed with a steroid injection. -A steroid injection was performed at left ankle joint using 1% plain Lidocaine and 10 mg of Kenalog. This was well tolerated.  Right second metatarsophalangeal joint capsulitis versus first interspace neuroma -I explained to the patient the etiology of capsulitis and various treatment options were discussed.  I believe patient will benefit from a steroid injection to help decrease the inflammatory component associated with capsulitis.  If patient has resolve meant of the pain at that time I will discuss with the patient orthotics management to take the stress off of the right foot.  Patient agrees with the plan. -A steroid injection was performed at right second MPJ using 1% plain Lidocaine and 10 mg of Kenalog. This was well tolerated. \   No follow-ups on file.

## 2020-01-04 ENCOUNTER — Other Ambulatory Visit: Payer: Self-pay

## 2020-01-04 ENCOUNTER — Ambulatory Visit
Admission: RE | Admit: 2020-01-04 | Discharge: 2020-01-04 | Disposition: A | Discharge status: Home / Self Care | Payer: 59 | Source: Ambulatory Visit | Attending: Obstetrics and Gynecology | Admitting: Obstetrics and Gynecology

## 2020-01-04 DIAGNOSIS — Z1231 Encounter for screening mammogram for malignant neoplasm of breast: Secondary | ICD-10-CM | POA: Insufficient documentation

## 2020-01-30 ENCOUNTER — Encounter: Payer: Self-pay | Admitting: Podiatry

## 2020-01-30 ENCOUNTER — Ambulatory Visit (INDEPENDENT_AMBULATORY_CARE_PROVIDER_SITE_OTHER): Payer: PRIVATE HEALTH INSURANCE | Admitting: Podiatry

## 2020-01-30 ENCOUNTER — Other Ambulatory Visit: Payer: Self-pay

## 2020-01-30 DIAGNOSIS — M7751 Other enthesopathy of right foot: Secondary | ICD-10-CM

## 2020-01-30 DIAGNOSIS — M19172 Post-traumatic osteoarthritis, left ankle and foot: Secondary | ICD-10-CM

## 2020-01-30 NOTE — Progress Notes (Signed)
Subjective:  Patient ID: Alicia Woods, female    DOB: [birth year],  MRN: [id number]  Chief Complaint  Patient presents with  . Foot Pain    My ankle is no better, still painful and swollen.  My right foot is getting better    54 y.o. female presents with the above complaint.  Patient presents with a follow-up of left ankle arthritis that still continues to be painful.  Patient states the injection did help.  This has been going on since 2002 after she had a posttraumatic injury led to arthritic changes.  The right side is doing better she is about 70% better.  She would like to know if she can do another injection we can get closer to 8095%.  She denies any other acute complaints.   Review of Systems: Negative except as noted in the HPI. Denies N/V/F/Ch.  Past Medical History:  Diagnosis Date  . Esophageal reflux   . Neck pain on left side    CAUSED BY CYST AND CAUSES SHOULDER PAIN;  DR. Sharlet Salina    Current Outpatient Medications:  .  Calcium Citrate 250 MG TABS, Take 1 capsule daily by mouth., Disp: , Rfl:  .  Cholecalciferol (VITAMIN D3) 2000 units capsule, Take 1 capsule daily by mouth., Disp: , Rfl:  .  cyclobenzaprine (FLEXERIL) 10 MG tablet, 1/2-1 po qHS prn, Disp: , Rfl:  .  diphenhydrAMINE (BENADRYL) 25 mg capsule, Take 1-2 tabs PO QHS, Disp: , Rfl:  .  fluticasone (FLONASE) 50 MCG/ACT nasal spray, Place into the nose., Disp: , Rfl:  .  gabapentin (NEURONTIN) 300 MG capsule, Take 1 capsule daily by mouth., Disp: , Rfl:  .  loratadine (CLARITIN) 10 MG tablet, Take by mouth., Disp: , Rfl:  .  meloxicam (MOBIC) 15 MG tablet, Take 15 mg by mouth daily., Disp: , Rfl:  .  Norethindrone-Ethinyl Estradiol-Fe Biphas (LO LOESTRIN FE) 1 MG-10 MCG / 10 MCG tablet, Take 1 tablet by mouth daily., Disp: 84 tablet, Rfl: 3 .  omeprazole (PRILOSEC) 20 MG capsule, Take 1 capsule daily by mouth., Disp: , Rfl:  .  Potassium 99 MG TABS, Take 1 tablet daily by mouth., Disp: , Rfl:  .  VITAMIN B  COMPLEX-C CAPS, Take 1 capsule daily by mouth., Disp: , Rfl:   Social History   Tobacco Use  Smoking Status Former Smoker  Smokeless Tobacco Never Used  Tobacco Comment   quit 2009    Allergies  Allergen Reactions  . Ciprofloxacin Hives  . Ciprocinonide [Fluocinolone] Itching  . Robitussin (Alcohol Free) [Guaifenesin] Swelling  . Penicillins Rash   Objective:  There were no vitals filed for this visit. There is no height or weight on file to calculate BMI. Constitutional Well developed. Well nourished.  Vascular Dorsalis pedis pulses palpable bilaterally. Posterior tibial pulses palpable bilaterally. Capillary refill normal to all digits.  No cyanosis or clubbing noted. Pedal hair growth normal.  Neurologic Normal speech. Oriented to person, place, and time. Epicritic sensation to light touch grossly present bilaterally.  Dermatologic Nails well groomed and normal in appearance. No open wounds. No skin lesions.  Orthopedic:  Pain on palpation the medial lateral ankle gutter to the left side.  Pain with range of motion of the ankle joint.  Stiffness noted to the ankle joint.  Intra-articular pain to the left ankle noted.  Mild crepitus clinically appreciated.  Right second metatarsophalangeal joint pain range of motion with pain at second metatarsophalangeal joint.  Positive Mulder's click. Subjective finding  of numbness to first and second toes facing sides.   Radiographs: 3 views of skeletally mature adult right foot left ankle: Severe arthritic changes noted to the left ankle joint with decrease in joint space.  No loose osteophytes noted.  History of previous fractures noted to the left side.  No bony abnormalities noted to the right side.  Negative Sullivan sign.  Mild hammertoe contractures noted. Assessment:   1. 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 Capsulitis of metatarsophalangeal (MTP) joint of right foot    Plan:  Patient was evaluated and  treated and all questions answered.  Left ankle degenerative joint disease -I explained to the patient the etiology of ankle degenerative joint disease and various treatment options were discussed.  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  Clinically the steroid injection did not help therefore I will hold off on another steroid injection.  I discussed with the patient briefly about doing a left ankle arthroscopy to help clean out the arthritis as well as visualized the severity of the arthritic changes.  Patient agrees with the plan she would like to discuss it with her husband and will discuss it in the future.  Right second metatarsophalangeal joint capsulitis  -I explained to the patient the etiology of capsulitis and various treatment options were discussed.  I believe patient will benefit from a steroid injection to help decrease the inflammatory component associated with capsulitis.  -Patient has had had orthotics done in the past for which she was not very happy with therefore I will hold off on any kind of orthotics for now. -A second steroid injection was performed at right second MPJ using 1% plain Lidocaine and 10 mg of Kenalog. This was well tolerated.    No follow-ups on file.

## 2020-02-29 ENCOUNTER — Encounter: Payer: Self-pay | Admitting: Podiatry

## 2020-02-29 ENCOUNTER — Ambulatory Visit: Payer: PRIVATE HEALTH INSURANCE | Admitting: Podiatry

## 2020-02-29 ENCOUNTER — Other Ambulatory Visit: Payer: Self-pay

## 2020-02-29 DIAGNOSIS — M7751 Other enthesopathy of right foot: Secondary | ICD-10-CM

## 2020-03-01 ENCOUNTER — Encounter: Payer: Self-pay | Admitting: Podiatry

## 2020-03-01 NOTE — Progress Notes (Signed)
Subjective:  Patient ID: Alicia Woods, female    DOB: [birth year],  MRN: [id number]  Chief Complaint  Patient presents with  . Ankle Pain    follow up    54 y.o. female presents with the above complaint.  Patient is following up from left ankle arthritis as well as right second metatarsophalangeal joint capsulitis.  Patient states she is doing a lot better.  Her pain is 100% resolved.  She denies any other acute complaints.  She has not thought about the left side yet she is controlled see if he gets worse will think about surgery to the left ankle.   Review of Systems: Negative except as noted in the HPI. Denies N/V/F/Ch.  Past Medical History:  Diagnosis Date  . Esophageal reflux   . Neck pain on left side    CAUSED BY CYST AND CAUSES SHOULDER PAIN;  DR. Sharlet Salina    Current Outpatient Medications:  .  Calcium Citrate 250 MG TABS, Take 1 capsule daily by mouth., Disp: , Rfl:  .  Cholecalciferol (VITAMIN D3) 2000 units capsule, Take 1 capsule daily by mouth., Disp: , Rfl:  .  cyclobenzaprine (FLEXERIL) 10 MG tablet, 1/2-1 po qHS prn, Disp: , Rfl:  .  diphenhydrAMINE (BENADRYL) 25 mg capsule, Take 1-2 tabs PO QHS, Disp: , Rfl:  .  fluticasone (FLONASE) 50 MCG/ACT nasal spray, Place into the nose., Disp: , Rfl:  .  gabapentin (NEURONTIN) 300 MG capsule, Take 1 capsule daily by mouth., Disp: , Rfl:  .  loratadine (CLARITIN) 10 MG tablet, Take by mouth., Disp: , Rfl:  .  meloxicam (MOBIC) 15 MG tablet, Take 15 mg by mouth daily., Disp: , Rfl:  .  Norethindrone-Ethinyl Estradiol-Fe Biphas (LO LOESTRIN FE) 1 MG-10 MCG / 10 MCG tablet, Take 1 tablet by mouth daily., Disp: 84 tablet, Rfl: 3 .  omeprazole (PRILOSEC) 20 MG capsule, Take 1 capsule daily by mouth., Disp: , Rfl:  .  Potassium 99 MG TABS, Take 1 tablet daily by mouth., Disp: , Rfl:  .  VITAMIN B COMPLEX-C CAPS, Take 1 capsule daily by mouth., Disp: , Rfl:   Social History   Tobacco Use  Smoking Status Former Smoker    Smokeless Tobacco Never Used  Tobacco Comment   quit 2009    Allergies  Allergen Reactions  . Ciprofloxacin Hives  . Ciprocinonide [Fluocinolone] Itching  . Robitussin (Alcohol Free) [Guaifenesin] Swelling  . Penicillins Rash   Objective:  There were no vitals filed for this visit. There is no height or weight on file to calculate BMI. Constitutional Well developed. Well nourished.  Vascular Dorsalis pedis pulses palpable bilaterally. Posterior tibial pulses palpable bilaterally. Capillary refill normal to all digits.  No cyanosis or clubbing noted. Pedal hair growth normal.  Neurologic Normal speech. Oriented to person, place, and time. Epicritic sensation to light touch grossly present bilaterally.  Dermatologic Nails well groomed and normal in appearance. No open wounds. No skin lesions.  Orthopedic:  Pain on palpation the medial lateral ankle gutter to the left side.  Pain with range of motion of the ankle joint.  Stiffness noted to the ankle joint.  Intra-articular pain to the left ankle noted.  Mild crepitus clinically appreciated.  Right second metatarsophalangeal joint no pain range of motion with pain at second metatarsophalangeal joint.  Negative Mulder's click. Subjective finding of numbness to first and second toes facing sides.   Radiographs: 3 views of skeletally mature adult right foot left ankle: Severe arthritic changes  noted to the left ankle joint with decrease in joint space.  No loose osteophytes noted.  History of previous fractures noted to the left side.  No bony abnormalities noted to the right side.  Negative Sullivan sign.  Mild hammertoe contractures noted. Assessment:   1. Capsulitis of metatarsophalangeal (MTP) joint of right foot    Plan:  Patient was evaluated and treated and all questions answered.  Left ankle degenerative joint disease -I explained to the patient the etiology of ankle degenerative joint disease and various treatment  options were discussed.  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  Clinically the steroid injection did not help therefore I will hold off on another steroid injection.  I discussed with the patient briefly about doing a left ankle arthroscopy to help clean out the arthritis as well as visualized the severity of the arthritic changes.  Patient agrees with the plan she would like to discuss it with her husband and will discuss it in the future.  Right second metatarsophalangeal joint capsulitis  -Clinically healed.  Patient is very happy with a steroid injection she does not have any pain with range of motion of the second metatarsophalangeal joint.  At this time I discussed with her if any foot and ankle issues arise in the future she will come back and see me.  Patient states understanding    No follow-ups on file.

## 2020-04-22 ENCOUNTER — Ambulatory Visit: Payer: PRIVATE HEALTH INSURANCE | Admitting: Obstetrics and Gynecology

## 2020-05-15 NOTE — Progress Notes (Signed)
PCP:  Maryland Pink, MD   Chief Complaint  Patient presents with  . Gynecologic Exam    Spotting the last few days     HPI:      [name] is a 55 y.o. No obstetric history on file. who LMP was No LMP recorded. (Menstrual status: Other)., presents today for her annual examination.  Her menses are absent with cont dosing Lo Loestrin OCPs. She tried doing placebo pills 2019 and had withdrawal bleed. Forgot to do placebo pills before today's appt.  Dysmenorrhea none. She does not have intermenstrual bleeding usually but has had a couple days of minimal spotting.   Sex activity: single partner, contraception - OCP (estrogen/progesterone).  Last Pap: 02/15/17  Results were: no abnormalities /neg HPV DNA  Hx of STDs: none  Last mammogram: 01/04/20  Results were: normal--routine follow-up in 12 months There is a FH of breast cancer in her MGM, age unkown. There is no FH of ovarian cancer. The patient does not do self-breast exams.  Tobacco use: The patient denies current or previous tobacco use. Alcohol use: none No drug use.  Exercise: min active  Colonoscopy: 2019 with Dr. Marius Ditch,  with polyp; repeat after 10 yrs.  She does get adequate calcium and Vitamin D in her diet.  Labs with PCP.   Past Medical History:  Diagnosis Date  . Esophageal reflux   . Neck pain on left side    CAUSED BY CYST AND CAUSES SHOULDER PAIN;  DR. Sharlet Salina    Past Surgical History:  Procedure Laterality Date  . ANKLE SURGERY Left   . BACK SURGERY    . COLONOSCOPY WITH PROPOFOL N/A 03/07/2018   Procedure: COLONOSCOPY WITH PROPOFOL;  Surgeon: Lin Landsman, MD;  Location: [facility];  Service: Gastroenterology;  Laterality: N/A;  . SPINAL FUSION    . WISDOM TOOTH EXTRACTION     TWO    Family History  Problem Relation Age of Onset  . Diabetes Mother        Type 1  . Breast cancer Maternal Grandmother        age unknown  . Other Father        Polio  . Muscular dystrophy  Sister     Social History   Socioeconomic History  . Marital status: Married    Spouse name: Not on file  . Number of children: Not on file  . Years of education: Not on file  . Highest education level: Not on file  Occupational History  . Not on file  Tobacco Use  . Smoking status: Former Research scientist (life sciences)  . Smokeless tobacco: Never Used  . Tobacco comment: quit 2009  Vaping Use  . Vaping Use: Never used  Substance and Sexual Activity  . Alcohol use: Yes    Comment: social   . Drug use: No  . Sexual activity: Yes    Birth control/protection: Post-menopausal  Other Topics Concern  . Not on file  Social History Narrative  . Not on file   Social Determinants of Health   Financial Resource Strain: Not on file  Food Insecurity: Not on file  Transportation Needs: Not on file  Physical Activity: Not on file  Stress: Not on file  Social Connections: Not on file  Intimate Partner Violence: Not on file    Current Meds  Medication Sig  . Calcium Citrate 250 MG TABS Take 1 capsule daily by mouth.  . Cholecalciferol (VITAMIN D3) 2000 units capsule Take 1  capsule daily by mouth.  . cyclobenzaprine (FLEXERIL) 10 MG tablet 1/2-1 po qHS prn  . diphenhydrAMINE (BENADRYL) 25 mg capsule Take 1-2 tabs PO QHS  . gabapentin (NEURONTIN) 300 MG capsule Take 1 capsule daily by mouth.  . meloxicam (MOBIC) 15 MG tablet Take 15 mg by mouth daily.  Marland Kitchen omeprazole (PRILOSEC) 20 MG capsule Take 1 capsule daily by mouth.  . Potassium 99 MG TABS Take 1 tablet daily by mouth.  . triamcinolone ointment (KENALOG) 0.1 % Apply topically 2 (two) times daily.  Marland Kitchen VITAMIN B COMPLEX-C CAPS Take 1 capsule daily by mouth.  . [DISCONTINUED] Norethindrone-Ethinyl Estradiol-Fe Biphas (LO LOESTRIN FE) 1 MG-10 MCG / 10 MCG tablet Take 1 tablet by mouth daily.     ROS:  Review of Systems  Constitutional: Negative for fatigue, fever and unexpected weight change.  Respiratory: Negative for cough, shortness of breath and  wheezing.   Cardiovascular: Negative for chest pain, palpitations and leg swelling.  Gastrointestinal: Negative for blood in stool, constipation, diarrhea, nausea and vomiting.  Endocrine: Negative for cold intolerance, heat intolerance and polyuria.  Genitourinary: Positive for vaginal bleeding. Negative for dyspareunia, dysuria, flank pain, frequency, genital sores, hematuria, menstrual problem, pelvic pain, urgency, vaginal discharge and vaginal pain.  Musculoskeletal: Negative for back pain, joint swelling and myalgias.  Skin: Negative for rash.  Neurological: Negative for dizziness, syncope, light-headedness, numbness and headaches.  Hematological: Negative for adenopathy.  Psychiatric/Behavioral: Negative for agitation, confusion, sleep disturbance and suicidal ideas. The patient is not nervous/anxious.      Objective: BP 110/80   Ht 5' 5.5" (1.664 m)   Wt 198 lb (89.8 kg)   BMI 32.45 kg/m    Physical Exam Constitutional:      Appearance: She is well-developed.  Genitourinary:     Vulva normal.     Right Labia: No rash, tenderness or lesions.    Left Labia: No tenderness, lesions or rash.    Vaginal bleeding present.     No vaginal discharge, erythema or tenderness.     Vaginal exam comments: TRACE MENSTRUAL BLEEDING VAG.      Right Adnexa: not tender and no mass present.    Left Adnexa: not tender and no mass present.    No cervical motion tenderness, friability or polyp.     Uterus is not enlarged or tender.  Breasts:     Right: No mass, nipple discharge, skin change or tenderness.     Left: No mass, nipple discharge, skin change or tenderness.    Neck:     Thyroid: No thyromegaly.  Cardiovascular:     Rate and Rhythm: Normal rate and regular rhythm.     Heart sounds: Normal heart sounds. No murmur heard.   Pulmonary:     Effort: Pulmonary effort is normal.     Breath sounds: Normal breath sounds.  Abdominal:     Palpations: Abdomen is soft.      Tenderness: There is no abdominal tenderness. There is no guarding or rebound.  Musculoskeletal:        General: Normal range of motion.     Cervical back: Normal range of motion.  Lymphadenopathy:     Cervical: No cervical adenopathy.  Neurological:     General: No focal deficit present.     Mental Status: She is alert and oriented to person, place, and time.     Cranial Nerves: No cranial nerve deficit.  Skin:    General: Skin is warm and dry.  Psychiatric:  Mood and Affect: Mood normal.        Behavior: Behavior normal.        Thought Content: Thought content normal.        Judgment: Judgment normal.  Vitals reviewed.     Assessment/Plan: Encounter for annual routine gynecological examination  Cervical cancer screening - Plan: Cytology - PAP  Screening for HPV (human papillomavirus) - Plan: Cytology - PAP  Encounter for surveillance of contraceptive pills - Plan: Norethindrone-Ethinyl Estradiol-Fe Biphas (LO LOESTRIN FE) 1 MG-10 MCG / 10 MCG tablet, OCR RF. Will re-eval next yr.   Breakthrough bleeding on OCPs--do placebo pills to see if has real period, then restart active pills. F/u if BTB persists for lab/u/s.   Encounter for screening mammogram for malignant neoplasm of breast - Plan: MM 3D SCREEN BREAST BILATERAL; pt to sched mammo   Meds ordered this encounter  Medications  . DISCONTD: Norethindrone-Ethinyl Estradiol-Fe Biphas (LO LOESTRIN FE) 1 MG-10 MCG / 10 MCG tablet    Sig: Take 1 tablet by mouth daily.    Dispense:  84 tablet    Refill:  3  . Norethindrone-Ethinyl Estradiol-Fe Biphas (LO LOESTRIN FE) 1 MG-10 MCG / 10 MCG tablet    Sig: Take 1 tablet by mouth daily.    Dispense:  84 tablet    Refill:  3    GYN counsel breast self exam, mammography screening, adequate intake of calcium and vitamin D, diet and exercise     F/U  Return in about 1 year (around 05/16/2021).  Ambera Fedele B. Jonica Bickhart, PA-C 05/16/2020 11:33 AM

## 2020-05-16 ENCOUNTER — Other Ambulatory Visit: Payer: Self-pay

## 2020-05-16 ENCOUNTER — Encounter: Payer: Self-pay | Admitting: Obstetrics and Gynecology

## 2020-05-16 ENCOUNTER — Ambulatory Visit (INDEPENDENT_AMBULATORY_CARE_PROVIDER_SITE_OTHER): Payer: PRIVATE HEALTH INSURANCE | Admitting: Obstetrics and Gynecology

## 2020-05-16 ENCOUNTER — Other Ambulatory Visit (HOSPITAL_COMMUNITY)
Admission: RE | Admit: 2020-05-16 | Discharge: 2020-05-16 | Disposition: A | Discharge status: Home / Self Care | Payer: 59 | Source: Ambulatory Visit | Attending: Obstetrics and Gynecology | Admitting: Obstetrics and Gynecology

## 2020-05-16 VITALS — BP 110/80 | Ht 65.5 in | Wt 198.0 lb

## 2020-05-16 DIAGNOSIS — Z1151 Encounter for screening for human papillomavirus (HPV): Secondary | ICD-10-CM

## 2020-05-16 DIAGNOSIS — Z1231 Encounter for screening mammogram for malignant neoplasm of breast: Secondary | ICD-10-CM

## 2020-05-16 DIAGNOSIS — Z01419 Encounter for gynecological examination (general) (routine) without abnormal findings: Secondary | ICD-10-CM

## 2020-05-16 DIAGNOSIS — Z3041 Encounter for surveillance of contraceptive pills: Secondary | ICD-10-CM

## 2020-05-16 DIAGNOSIS — Z124 Encounter for screening for malignant neoplasm of cervix: Secondary | ICD-10-CM | POA: Diagnosis not present

## 2020-05-16 DIAGNOSIS — N921 Excessive and frequent menstruation with irregular cycle: Secondary | ICD-10-CM

## 2020-05-16 MED ORDER — NORETHIN-ETH ESTRAD-FE BIPHAS 1 MG-10 MCG / 10 MCG PO TABS: 1.0000 | ORAL_TABLET | Freq: Every day | ORAL | 3 refills | Status: DC

## 2020-05-16 NOTE — Patient Instructions (Addendum)
I value your feedback and you entrusting us with your care. If you get a [facility] patient survey, I would appreciate you taking the time to let us know about your experience today. Thank you!  [facility]: [phone]      

## 2020-05-20 LAB — CYTOLOGY - PAP
Comment: NEGATIVE
Diagnosis: NEGATIVE
Diagnosis: REACTIVE
High risk HPV: NEGATIVE

## 2020-08-23 ENCOUNTER — Telehealth: Payer: Self-pay | Admitting: *Deleted

## 2020-08-23 NOTE — Telephone Encounter (Signed)
"  I'm looking for medical records at the [place] office.  Please give me a call."

## 2020-09-03 ENCOUNTER — Ambulatory Visit: Payer: PRIVATE HEALTH INSURANCE | Admitting: Podiatry

## 2020-09-05 ENCOUNTER — Other Ambulatory Visit: Payer: Self-pay | Admitting: Podiatry

## 2020-09-05 ENCOUNTER — Other Ambulatory Visit: Payer: Self-pay

## 2020-09-05 ENCOUNTER — Ambulatory Visit: Payer: PRIVATE HEALTH INSURANCE | Admitting: Podiatry

## 2020-09-05 ENCOUNTER — Ambulatory Visit (INDEPENDENT_AMBULATORY_CARE_PROVIDER_SITE_OTHER): Payer: PRIVATE HEALTH INSURANCE

## 2020-09-05 DIAGNOSIS — T148XXA Other injury of unspecified body region, initial encounter: Secondary | ICD-10-CM | POA: Diagnosis not present

## 2020-09-05 DIAGNOSIS — S9031XA Contusion of right foot, initial encounter: Secondary | ICD-10-CM | POA: Diagnosis not present

## 2020-09-05 DIAGNOSIS — M67471 Ganglion, right ankle and foot: Secondary | ICD-10-CM | POA: Diagnosis not present

## 2020-09-05 NOTE — Progress Notes (Signed)
Dg f 

## 2020-09-10 ENCOUNTER — Encounter: Payer: Self-pay | Admitting: Podiatry

## 2020-09-10 NOTE — Progress Notes (Signed)
Subjective:  Patient ID: Alicia Woods, female    DOB: [birth year],  MRN: [id number]  Chief Complaint  Patient presents with   Foot Pain    Right foot pain     55 y.o. female presents with the above complaint.  Patient presents with complaint of right dorsal forefoot pain.  Patient states that she is having pain right on top of the foot has been also gotten worse.  She states that she does not recall any trauma to the area she may have dropped something long time ago.  But she does not recall.  She would like to discuss treatment options for the cyst.  She does not want to go to surgery to have it taken out.  She does well with local anesthesia.  She has not seen anyone else prior to seeing me.  Pain scale is 8 out of 10 when something rubs against it.   Review of Systems: Negative except as noted in the HPI. Denies N/V/F/Ch.  Past Medical History:  Diagnosis Date   Esophageal reflux    Neck pain on left side    CAUSED BY CYST AND CAUSES SHOULDER PAIN;  DR. Sharlet Salina    Current Outpatient Medications:    Calcium Citrate 250 MG TABS, Take 1 capsule daily by mouth., Disp: , Rfl:    Cholecalciferol (VITAMIN D3) 2000 units capsule, Take 1 capsule daily by mouth., Disp: , Rfl:    cyclobenzaprine (FLEXERIL) 10 MG tablet, 1/2-1 po qHS prn, Disp: , Rfl:    diphenhydrAMINE (BENADRYL) 25 mg capsule, Take 1-2 tabs PO QHS, Disp: , Rfl:    fluticasone (FLONASE) 50 MCG/ACT nasal spray, Place into the nose., Disp: , Rfl:    gabapentin (NEURONTIN) 300 MG capsule, Take 1 capsule daily by mouth., Disp: , Rfl:    loratadine (CLARITIN) 10 MG tablet, Take by mouth., Disp: , Rfl:    meloxicam (MOBIC) 15 MG tablet, Take 15 mg by mouth daily., Disp: , Rfl:    Norethindrone-Ethinyl Estradiol-Fe Biphas (LO LOESTRIN FE) 1 MG-10 MCG / 10 MCG tablet, Take 1 tablet by mouth daily., Disp: 84 tablet, Rfl: 3   omeprazole (PRILOSEC) 20 MG capsule, Take 1 capsule daily by mouth., Disp: , Rfl:    Potassium 99 MG TABS,  Take 1 tablet daily by mouth., Disp: , Rfl:    triamcinolone ointment (KENALOG) 0.1 %, Apply topically 2 (two) times daily., Disp: , Rfl:    VITAMIN B COMPLEX-C CAPS, Take 1 capsule daily by mouth., Disp: , Rfl:   Social History   Tobacco Use  Smoking Status Former   Pack years: 0.00  Smokeless Tobacco Never  Tobacco Comments   quit 2009    Allergies  Allergen Reactions   Ciprofloxacin Hives   Ciprocinonide [Fluocinolone] Itching   Robitussin (Alcohol Free) [Guaifenesin] Swelling   Penicillins Rash   Objective:  There were no vitals filed for this visit. There is no height or weight on file to calculate BMI. Constitutional Well developed. Well nourished.  Vascular Dorsalis pedis pulses palpable bilaterally. Posterior tibial pulses palpable bilaterally. Capillary refill normal to all digits.  No cyanosis or clubbing noted. Pedal hair growth normal.  Neurologic Normal speech. Oriented to person, place, and time. Epicritic sensation to light touch grossly present bilaterally.  Dermatologic Nails well groomed and normal in appearance. No open wounds. No skin lesions.  Orthopedic: Focalized collection of cysts noted.  Fluctuance.  No purulent drainage likely clinically expected.  Single lobulated mass.  Negative transilluminates.  Radiographs: 3 views of skeletally mature the right foot : No fractures noted.  Soft tissue edema noted to the dorsal forefoot.  Posterior and plantar heel spurring noted.  No other bony abnormalities identified. Assessment:   1. Hematoma of right foot   2. Ganglion cyst of right foot    Plan:  Patient was evaluated and treated and all questions answered.  Right foot hematoma/soft tissue mass/cyst -I explained the patient the etiology of hematoma and various treatment options were discussed.  Given the amount of pain in the setting of not wanting to go to surgery I believe patient will benefit from drainage of the hematoma/cyst.  I discussed my  preoperative medical intraoperative surgical plan in extensive detail.  We will plan on doing this in the office. -I discussed recurrence rates is very high especially with just aspiration drainage.  I discussed this with the patient.  She would like to proceed with that regardless of the recurrence rate.  If it does recur we will plan on doing it in the surgical center.  She states understanding  Aspiration and drainage of right foot cyst -Skin was prepped with Betadine.  One-to-one mixture of 1% lidocaine plain half percent Marcaine plain was obtained to provide local anesthesia V-block shaped.  At this time an 18-gauge needle with a 10 cc syringe was utilized to drain out the soft tissue cyst versus hematoma.  At this time is important note that hematoma was noted.  No signs of cyst or infection noted.  No follow-ups on file.

## 2020-09-17 ENCOUNTER — Other Ambulatory Visit: Payer: Self-pay | Admitting: Physical Medicine and Rehabilitation

## 2020-09-17 DIAGNOSIS — M4802 Spinal stenosis, cervical region: Secondary | ICD-10-CM

## 2020-09-17 DIAGNOSIS — M5412 Radiculopathy, cervical region: Secondary | ICD-10-CM

## 2020-09-18 NOTE — Telephone Encounter (Signed)
I am calling to see if you have received your medical records.  "I have not, I called about three weeks ago about that.  How do I go about getting them?"  You must come by the office and complete a form so we can release them.  "What time are you open?"  We're open from 8 am to 5 pm.  "Okay, thank you."

## 2020-10-04 ENCOUNTER — Other Ambulatory Visit: Payer: PRIVATE HEALTH INSURANCE

## 2020-10-08 ENCOUNTER — Other Ambulatory Visit: Payer: PRIVATE HEALTH INSURANCE

## 2020-10-22 ENCOUNTER — Other Ambulatory Visit: Payer: Self-pay

## 2020-10-22 ENCOUNTER — Ambulatory Visit
Admission: RE | Admit: 2020-10-22 | Discharge: 2020-10-22 | Disposition: A | Discharge status: Home / Self Care | Payer: PRIVATE HEALTH INSURANCE | Source: Ambulatory Visit | Attending: Physical Medicine and Rehabilitation | Admitting: Physical Medicine and Rehabilitation

## 2020-10-22 DIAGNOSIS — M4802 Spinal stenosis, cervical region: Secondary | ICD-10-CM

## 2020-10-22 DIAGNOSIS — M5412 Radiculopathy, cervical region: Secondary | ICD-10-CM

## 2020-11-11 ENCOUNTER — Telehealth: Payer: Self-pay | Admitting: Podiatry

## 2020-11-11 NOTE — Telephone Encounter (Signed)
Patient called stating she was told to call the office if her fluid came back on her toe so she could have it done in the OR. Patient wants the RN or someone to call her about possibly doing the surgery. Pts number [id number]

## 2020-11-12 NOTE — Telephone Encounter (Signed)
Per Dr. Posey Pronto okay to scheduled her for surgery in October. I left Alicia Woods a message letting her know to call me to schedule a day in October but that she will also need to schedule an appointment with Dr. Posey Pronto in the [facility] office to review surgery details and sign consent forms.

## 2020-12-18 ENCOUNTER — Telehealth: Payer: Self-pay | Admitting: Urology

## 2020-12-18 NOTE — Telephone Encounter (Signed)
DOS - 01/06/21  EXC GANGLION/ TUMOR RIGHT --- 28090   SPOKE WITH AMELA WITH [employer] AND SHE STATED THAT FOR CPT CODE 30149 NO PRIOR AUTH IS REQUIRED.  REF # AMELA M. 12/18/20

## 2021-01-06 ENCOUNTER — Encounter: Payer: Self-pay | Admitting: Podiatry

## 2021-01-06 ENCOUNTER — Other Ambulatory Visit: Payer: Self-pay | Admitting: Podiatry

## 2021-01-06 DIAGNOSIS — D492 Neoplasm of unspecified behavior of bone, soft tissue, and skin: Secondary | ICD-10-CM | POA: Diagnosis not present

## 2021-01-06 DIAGNOSIS — M67471 Ganglion, right ankle and foot: Secondary | ICD-10-CM

## 2021-01-06 MED ORDER — IBUPROFEN 800 MG PO TABS: 800.0000 mg | ORAL_TABLET | Freq: Four times a day (QID) | ORAL | 1 refills | Status: DC | PRN

## 2021-01-06 MED ORDER — OXYCODONE-ACETAMINOPHEN 5-325 MG PO TABS: 1.0000 | ORAL_TABLET | ORAL | 0 refills | Status: DC | PRN

## 2021-01-14 ENCOUNTER — Ambulatory Visit
Admission: RE | Admit: 2021-01-14 | Discharge: 2021-01-14 | Disposition: A | Discharge status: Home / Self Care | Payer: 59 | Source: Ambulatory Visit | Attending: Obstetrics and Gynecology | Admitting: Obstetrics and Gynecology

## 2021-01-14 ENCOUNTER — Ambulatory Visit (INDEPENDENT_AMBULATORY_CARE_PROVIDER_SITE_OTHER): Payer: PRIVATE HEALTH INSURANCE | Admitting: Podiatry

## 2021-01-14 ENCOUNTER — Encounter: Payer: PRIVATE HEALTH INSURANCE | Admitting: Podiatry

## 2021-01-14 ENCOUNTER — Other Ambulatory Visit: Payer: Self-pay

## 2021-01-14 DIAGNOSIS — M67471 Ganglion, right ankle and foot: Secondary | ICD-10-CM

## 2021-01-14 DIAGNOSIS — Z9889 Other specified postprocedural states: Secondary | ICD-10-CM

## 2021-01-14 DIAGNOSIS — Z1231 Encounter for screening mammogram for malignant neoplasm of breast: Secondary | ICD-10-CM | POA: Insufficient documentation

## 2021-01-14 NOTE — Progress Notes (Signed)
Subjective:  Patient ID: Alicia Woods, female    DOB: 06-18-1965,  MRN: [id number]  Chief Complaint  Patient presents with   Routine Post Op    DOS 10.10.22     DOS: 01/06/2021 Procedure: Right excision of soft tissue mass  55 y.o. female returns for post-op check.  Patient states she is doing well.  No acute complaints.  Ambulating in surgical shoe.  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  Review of Systems: Negative except as noted in the HPI. Denies N/V/F/Ch.  Past Medical History:  Diagnosis Date   Esophageal reflux    Neck pain on left side    CAUSED BY CYST AND CAUSES SHOULDER PAIN;  DR. Sharlet Salina    Current Outpatient Medications:    Calcium Citrate 250 MG TABS, Take 1 capsule daily by mouth., Disp: , Rfl:    Cholecalciferol (VITAMIN D3) 2000 units capsule, Take 1 capsule daily by mouth., Disp: , Rfl:    cyclobenzaprine (FLEXERIL) 10 MG tablet, 1/2-1 po qHS prn, Disp: , Rfl:    diphenhydrAMINE (BENADRYL) 25 mg capsule, Take 1-2 tabs PO QHS, Disp: , Rfl:    fluticasone (FLONASE) 50 MCG/ACT nasal spray, Place into the nose., Disp: , Rfl:    gabapentin (NEURONTIN) 300 MG capsule, Take 1 capsule daily by mouth., Disp: , Rfl:    ibuprofen (ADVIL) 800 MG tablet, Take 1 tablet (800 mg total) by mouth every 6 (six) hours as needed., Disp: 60 tablet, Rfl: 1   loratadine (CLARITIN) 10 MG tablet, Take by mouth., Disp: , Rfl:    meloxicam (MOBIC) 15 MG tablet, Take 15 mg by mouth daily., Disp: , Rfl:    Norethindrone-Ethinyl Estradiol-Fe Biphas (LO LOESTRIN FE) 1 MG-10 MCG / 10 MCG tablet, Take 1 tablet by mouth daily., Disp: 84 tablet, Rfl: 3   omeprazole (PRILOSEC) 20 MG capsule, Take 1 capsule daily by mouth., Disp: , Rfl:    oxyCODONE-acetaminophen (PERCOCET) 5-325 MG tablet, Take 1 tablet by mouth every 4 (four) hours as needed for severe pain., Disp: 30 tablet, Rfl: 0   Potassium 99 MG TABS, Take 1 tablet daily by mouth., Disp: , Rfl:     triamcinolone ointment (KENALOG) 0.1 %, Apply topically 2 (two) times daily., Disp: , Rfl:    VITAMIN B COMPLEX-C CAPS, Take 1 capsule daily by mouth., Disp: , Rfl:   Social History   Tobacco Use  Smoking Status Former  Smokeless Tobacco Never  Tobacco Comments   quit 2009    Allergies  Allergen Reactions   Ciprofloxacin Hives   Ciprocinonide [Fluocinolone] Itching   Robitussin (Alcohol Free) [Guaifenesin] Swelling   Penicillins Rash   Objective:  There were no vitals filed for this visit. There is no height or weight on file to calculate BMI. Constitutional Well developed. Well nourished.  Vascular Foot warm and well perfused. Capillary refill normal to all digits.   Neurologic Normal speech. Oriented to person, place, and time. Epicritic sensation to light touch grossly present bilaterally.  Dermatologic Skin healing well without signs of infection. Skin edges well coapted without signs of infection.  Orthopedic: Tenderness to palpation noted about the surgical site.   Radiographs: None Assessment:   1. Ganglion cyst of right foot   2. Status post foot surgery    Plan:  Patient was evaluated and treated and all questions answered.  S/p foot surgery right -Progressing as expected post-operatively. -XR: None -WB Status: Weightbearing as tolerated  in surgical shoe -Sutures: Intact.  No clinical signs of dehiscence noted.  No complication noted. -Medications: None -Foot redressed.  No follow-ups on file.

## 2021-01-17 ENCOUNTER — Telehealth: Payer: Self-pay | Admitting: Podiatry

## 2021-01-17 NOTE — Telephone Encounter (Signed)
Patient called stating she went back to work and she wanted to see if you can call her in some pain meds. Patient is in some pain now that she went back to work. Patient uses [place], not [facility]. Patient phone number is [phone].

## 2021-01-20 MED ORDER — OXYCODONE-ACETAMINOPHEN 5-325 MG PO TABS: 1.0000 | ORAL_TABLET | ORAL | 0 refills | Status: DC | PRN

## 2021-01-28 ENCOUNTER — Encounter: Payer: PRIVATE HEALTH INSURANCE | Admitting: Podiatry

## 2021-01-30 ENCOUNTER — Ambulatory Visit (INDEPENDENT_AMBULATORY_CARE_PROVIDER_SITE_OTHER): Payer: PRIVATE HEALTH INSURANCE | Admitting: Podiatry

## 2021-01-30 ENCOUNTER — Other Ambulatory Visit: Payer: Self-pay

## 2021-01-30 DIAGNOSIS — M67471 Ganglion, right ankle and foot: Secondary | ICD-10-CM

## 2021-01-30 DIAGNOSIS — Z9889 Other specified postprocedural states: Secondary | ICD-10-CM

## 2021-01-30 NOTE — Progress Notes (Signed)
Subjective:  Patient ID: Alicia Woods, female    DOB: 09-01-1965,  MRN: [id number]  Chief Complaint  Patient presents with   Routine Post Op    DOS 10.10.22    DOS: 01/06/2021 Procedure: Right excision of soft tissue mass  55 y.o. female returns for post-op check.  Patient states she is doing well.  No acute complaints.  She denies any other acute complaints.  She has been sitting down on her work.  Review of Systems: Negative except as noted in the HPI. Denies N/V/F/Ch.  Past Medical History:  Diagnosis Date   Esophageal reflux    Neck pain on left side    CAUSED BY CYST AND CAUSES SHOULDER PAIN;  DR. Sharlet Salina    Current Outpatient Medications:    Calcium Citrate 250 MG TABS, Take 1 capsule daily by mouth., Disp: , Rfl:    Cholecalciferol (VITAMIN D3) 2000 units capsule, Take 1 capsule daily by mouth., Disp: , Rfl:    cyclobenzaprine (FLEXERIL) 10 MG tablet, 1/2-1 po qHS prn, Disp: , Rfl:    diphenhydrAMINE (BENADRYL) 25 mg capsule, Take 1-2 tabs PO QHS, Disp: , Rfl:    fluticasone (FLONASE) 50 MCG/ACT nasal spray, Place into the nose., Disp: , Rfl:    gabapentin (NEURONTIN) 300 MG capsule, Take 1 capsule daily by mouth., Disp: , Rfl:    ibuprofen (ADVIL) 800 MG tablet, Take 1 tablet (800 mg total) by mouth every 6 (six) hours as needed., Disp: 60 tablet, Rfl: 1   loratadine (CLARITIN) 10 MG tablet, Take by mouth., Disp: , Rfl:    meloxicam (MOBIC) 15 MG tablet, Take 15 mg by mouth daily., Disp: , Rfl:    Norethindrone-Ethinyl Estradiol-Fe Biphas (LO LOESTRIN FE) 1 MG-10 MCG / 10 MCG tablet, Take 1 tablet by mouth daily., Disp: 84 tablet, Rfl: 3   omeprazole (PRILOSEC) 20 MG capsule, Take 1 capsule daily by mouth., Disp: , Rfl:    oxyCODONE-acetaminophen (PERCOCET) 5-325 MG tablet, Take 1 tablet by mouth every 4 (four) hours as needed for severe pain., Disp: 30 tablet, Rfl: 0   oxyCODONE-acetaminophen (PERCOCET) 5-325 MG tablet, Take 1 tablet by mouth every 4 (four) hours as  needed for severe pain., Disp: 30 tablet, Rfl: 0   Potassium 99 MG TABS, Take 1 tablet daily by mouth., Disp: , Rfl:    triamcinolone ointment (KENALOG) 0.1 %, Apply topically 2 (two) times daily., Disp: , Rfl:    VITAMIN B COMPLEX-C CAPS, Take 1 capsule daily by mouth., Disp: , Rfl:   Social History   Tobacco Use  Smoking Status Former  Smokeless Tobacco Never  Tobacco Comments   quit 2009    Allergies  Allergen Reactions   Ciprofloxacin Hives   Ciprocinonide [Fluocinolone] Itching   Robitussin (Alcohol Free) [Guaifenesin] Swelling   Penicillins Rash   Objective:  There were no vitals filed for this visit. There is no height or weight on file to calculate BMI. Constitutional Well developed. Well nourished.  Vascular Foot warm and well perfused. Capillary refill normal to all digits.   Neurologic Normal speech. Oriented to person, place, and time. Epicritic sensation to light touch grossly present bilaterally.  Dermatologic Skin completely epithelialized.  No signs of recurrence noted no infection noted.  Orthopedic: No further tenderness to palpation noted about the surgical site.   Radiographs: None Assessment:   1. Ganglion cyst of right foot   2. Status post foot surgery     Plan:  Patient was evaluated and treated and  all questions answered.  S/p foot surgery right -Clinically discharged from my standpoint.  No recurrence noted.  At this time I discussed shoe gear modification and if there is a recurrence come back and see me.  She states understanding.  No follow-ups on file.

## 2021-02-10 ENCOUNTER — Other Ambulatory Visit: Payer: Self-pay | Admitting: Podiatry

## 2021-02-10 NOTE — Telephone Encounter (Signed)
Medication refill request-ibuprofren-800 mg tablet. Please advise.

## 2021-02-26 ENCOUNTER — Telehealth: Payer: Self-pay

## 2021-02-26 NOTE — Telephone Encounter (Signed)
Spoke with pt. Was going to have her do placebo pills 2/23 at annual anyway to see if still having periods. Go ahead and do placebo pills now. If still has periods, will change Rx to Junel 24 Fe. If no bleeding, will follow cycles. F/u prn. Annual due 2/23

## 2021-02-26 NOTE — Telephone Encounter (Signed)
Pt calling stating that her new insurance is not paying for the LoLoestrin and requesting Junel FE be sent in. Please advise/ send in medicine.

## 2021-02-27 ENCOUNTER — Ambulatory Visit: Payer: PRIVATE HEALTH INSURANCE | Admitting: Podiatry

## 2021-02-27 ENCOUNTER — Other Ambulatory Visit: Payer: Self-pay

## 2021-02-27 DIAGNOSIS — M778 Other enthesopathies, not elsewhere classified: Secondary | ICD-10-CM

## 2021-03-04 ENCOUNTER — Encounter: Payer: Self-pay | Admitting: Podiatry

## 2021-03-04 NOTE — Progress Notes (Signed)
Subjective:  Patient ID: Alicia Woods, female    DOB: 08-Feb-1966,  MRN: [id number]  Chief Complaint  Patient presents with   Foot Pain    55 y.o. female presents with the above complaint.  Patient presents with new complaint of right dorsal midfoot pain.  Patient states his foot pain has been going on for quite some time has progressed gotten over the last 3 days it popped out of nowhere.  It feels hot.  She would like to discuss steroid injection for this.  She has not seen anyone else prior to seeing me.  Her sore surgical site is doing good.  No signs of recurrence noted.  Pain scale is 6 out of 10 hurts with ambulation.  She does not think that there is anything rubbing against it.   Review of Systems: Negative except as noted in the HPI. Denies N/V/F/Ch.  Past Medical History:  Diagnosis Date   Esophageal reflux    Neck pain on left side    CAUSED BY CYST AND CAUSES SHOULDER PAIN;  DR. Sharlet Salina    Current Outpatient Medications:    Calcium Citrate 250 MG TABS, Take 1 capsule daily by mouth., Disp: , Rfl:    Cholecalciferol (VITAMIN D3) 2000 units capsule, Take 1 capsule daily by mouth., Disp: , Rfl:    cyclobenzaprine (FLEXERIL) 10 MG tablet, 1/2-1 po qHS prn, Disp: , Rfl:    diphenhydrAMINE (BENADRYL) 25 mg capsule, Take 1-2 tabs PO QHS, Disp: , Rfl:    fluticasone (FLONASE) 50 MCG/ACT nasal spray, Place into the nose., Disp: , Rfl:    gabapentin (NEURONTIN) 300 MG capsule, Take 1 capsule daily by mouth., Disp: , Rfl:    ibuprofen (ADVIL) 800 MG tablet, TAKE 1 TABLET BY MOUTH EVERY 6 HOURS AS NEEDED., Disp: 60 tablet, Rfl: 1   loratadine (CLARITIN) 10 MG tablet, Take by mouth., Disp: , Rfl:    meloxicam (MOBIC) 15 MG tablet, Take 15 mg by mouth daily., Disp: , Rfl:    Norethindrone-Ethinyl Estradiol-Fe Biphas (LO LOESTRIN FE) 1 MG-10 MCG / 10 MCG tablet, Take 1 tablet by mouth daily., Disp: 84 tablet, Rfl: 3   omeprazole (PRILOSEC) 20 MG capsule, Take 1 capsule daily by mouth.,  Disp: , Rfl:    oxyCODONE-acetaminophen (PERCOCET) 5-325 MG tablet, Take 1 tablet by mouth every 4 (four) hours as needed for severe pain., Disp: 30 tablet, Rfl: 0   oxyCODONE-acetaminophen (PERCOCET) 5-325 MG tablet, Take 1 tablet by mouth every 4 (four) hours as needed for severe pain., Disp: 30 tablet, Rfl: 0   Potassium 99 MG TABS, Take 1 tablet daily by mouth., Disp: , Rfl:    triamcinolone ointment (KENALOG) 0.1 %, Apply topically 2 (two) times daily., Disp: , Rfl:    VITAMIN B COMPLEX-C CAPS, Take 1 capsule daily by mouth., Disp: , Rfl:   Social History   Tobacco Use  Smoking Status Former  Smokeless Tobacco Never  Tobacco Comments   quit 2009    Allergies  Allergen Reactions   Ciprofloxacin Hives   Ciprocinonide [Fluocinolone] Itching   Robitussin (Alcohol Free) [Guaifenesin] Swelling   Penicillins Rash   Objective:  There were no vitals filed for this visit. There is no height or weight on file to calculate BMI. Constitutional Well developed. Well nourished.  Vascular Dorsalis pedis pulses palpable bilaterally. Posterior tibial pulses palpable bilaterally. Capillary refill normal to all digits.  No cyanosis or clubbing noted. Pedal hair growth normal.  Neurologic Normal speech. Oriented to person,  place, and time. Epicritic sensation to light touch grossly present bilaterally.  Dermatologic Nails well groomed and normal in appearance. No open wounds. No skin lesions.  Orthopedic: Pain on palpation right dorsal midfoot.  No pain at the previous surgical mark.  Minimal scarring noted at the previous surgical site.   Radiographs: None Assessment:   1. Capsulitis of foot, right    Plan:  Patient was evaluated and treated and all questions answered.  Right dorsal midfoot capsulitis -I explained the patient the etiology of capsulitis numbers treatment options were extensively discussed.  Given the amount of pain that she is having I believe she would benefit from  a steroid injection of decrease acute inflammatory component associate with pain.  Patient agrees with plan to proceed with steroid injection -A steroid injection was performed at right dorsal midfoot using 1% plain Lidocaine and 10 mg of Kenalog. This was well tolerated.   No follow-ups on file.

## 2021-03-06 ENCOUNTER — Telehealth: Payer: Self-pay

## 2021-03-06 NOTE — Telephone Encounter (Signed)
Pt calling; was told to call in a few days after she took last four days of her bc to see if she started; wants [facility] to call her back to discuss this.  [phone]

## 2021-03-06 NOTE — Telephone Encounter (Signed)
Did pt start bleeding? If not, then stay off pills and we'll re-evaluate each month vs at 2/23 annual. Pregnancy not concern. If did bleed, will send in different OCPs for insurance purposes.

## 2021-03-10 ENCOUNTER — Other Ambulatory Visit: Payer: PRIVATE HEALTH INSURANCE

## 2021-03-10 NOTE — Telephone Encounter (Signed)
Just go ahead and take Lo Lo till annual2/23, we'll then change to junel at her appt (unless she needs Rx sooner). Junel has a little higher estrogen level (which is fine) and is generic for insurance coverage.

## 2021-03-10 NOTE — Telephone Encounter (Signed)
Pt aware. Transferred to [place] to schedule annual.

## 2021-03-12 ENCOUNTER — Other Ambulatory Visit: Payer: PRIVATE HEALTH INSURANCE

## 2021-04-01 ENCOUNTER — Ambulatory Visit: Payer: PRIVATE HEALTH INSURANCE | Admitting: Podiatry

## 2021-04-01 ENCOUNTER — Other Ambulatory Visit: Payer: Self-pay

## 2021-04-01 ENCOUNTER — Encounter: Payer: Self-pay | Admitting: Podiatry

## 2021-04-01 DIAGNOSIS — M778 Other enthesopathies, not elsewhere classified: Secondary | ICD-10-CM

## 2021-04-01 NOTE — Progress Notes (Signed)
Subjective:  Patient ID: Alicia Woods, female    DOB: 1966/02/21,  MRN: [id number]  Chief Complaint  Patient presents with   Foot Pain    Right foot pain  Pt stated that her foot is doing better she still has some soreness     56 y.o. female presents with the above complaint.  Patient presents with new complaint of right dorsal midfoot pain.  Patient states his foot pain has been going on for quite some time has progressed gotten over the last 3 days it popped out of nowhere.  It feels hot.  She would like to discuss steroid injection for this.  She has not seen anyone else prior to seeing me.  Her sore surgical site is doing good.  No signs of recurrence noted.  Pain scale is 6 out of 10 hurts with ambulation.  She does not think that there is anything rubbing against it.   Review of Systems: Negative except as noted in the HPI. Denies N/V/F/Ch.  Past Medical History:  Diagnosis Date   Esophageal reflux    Neck pain on left side    CAUSED BY CYST AND CAUSES SHOULDER PAIN;  DR. Sharlet Salina    Current Outpatient Medications:    Calcium Citrate 250 MG TABS, Take 1 capsule daily by mouth., Disp: , Rfl:    Cholecalciferol (VITAMIN D3) 2000 units capsule, Take 1 capsule daily by mouth., Disp: , Rfl:    cyclobenzaprine (FLEXERIL) 10 MG tablet, 1/2-1 po qHS prn, Disp: , Rfl:    diphenhydrAMINE (BENADRYL) 25 mg capsule, Take 1-2 tabs PO QHS, Disp: , Rfl:    fluticasone (FLONASE) 50 MCG/ACT nasal spray, Place into the nose., Disp: , Rfl:    gabapentin (NEURONTIN) 300 MG capsule, Take 1 capsule daily by mouth., Disp: , Rfl:    ibuprofen (ADVIL) 800 MG tablet, TAKE 1 TABLET BY MOUTH EVERY 6 HOURS AS NEEDED., Disp: 60 tablet, Rfl: 1   loratadine (CLARITIN) 10 MG tablet, Take by mouth., Disp: , Rfl:    meloxicam (MOBIC) 15 MG tablet, Take 15 mg by mouth daily., Disp: , Rfl:    Norethindrone-Ethinyl Estradiol-Fe Biphas (LO LOESTRIN FE) 1 MG-10 MCG / 10 MCG tablet, Take 1 tablet by mouth daily.,  Disp: 84 tablet, Rfl: 3   omeprazole (PRILOSEC) 20 MG capsule, Take 1 capsule daily by mouth., Disp: , Rfl:    oxyCODONE-acetaminophen (PERCOCET) 5-325 MG tablet, Take 1 tablet by mouth every 4 (four) hours as needed for severe pain., Disp: 30 tablet, Rfl: 0   oxyCODONE-acetaminophen (PERCOCET) 5-325 MG tablet, Take 1 tablet by mouth every 4 (four) hours as needed for severe pain., Disp: 30 tablet, Rfl: 0   Potassium 99 MG TABS, Take 1 tablet daily by mouth., Disp: , Rfl:    triamcinolone ointment (KENALOG) 0.1 %, Apply topically 2 (two) times daily., Disp: , Rfl:    VITAMIN B COMPLEX-C CAPS, Take 1 capsule daily by mouth., Disp: , Rfl:   Social History   Tobacco Use  Smoking Status Former  Smokeless Tobacco Never  Tobacco Comments   quit 2009    Allergies  Allergen Reactions   Ciprofloxacin Hives   Ciprocinonide [Fluocinolone] Itching   Robitussin (Alcohol Free) [Guaifenesin] Swelling   Penicillins Rash   Objective:  There were no vitals filed for this visit. There is no height or weight on file to calculate BMI. Constitutional Well developed. Well nourished.  Vascular Dorsalis pedis pulses palpable bilaterally. Posterior tibial pulses palpable bilaterally. Capillary refill  normal to all digits.  No cyanosis or clubbing noted. Pedal hair growth normal.  Neurologic Normal speech. Oriented to person, place, and time. Epicritic sensation to light touch grossly present bilaterally.  Dermatologic Nails well groomed and normal in appearance. No open wounds. No skin lesions.  Orthopedic: Pain on palpation right dorsal midfoot.  No pain at the previous surgical mark.  Minimal scarring noted at the previous surgical site.   Radiographs: None Assessment:   1. Capsulitis of foot, right     Plan:  Patient was evaluated and treated and all questions answered.  Right dorsal midfoot capsulitis to the distal part of the incision -I explained the patient the etiology of capsulitis  numbers treatment options were extensively discussed.  Given the amount of pain that she is having I believe she would benefit from a steroid injection of decrease acute inflammatory component associate with pain.  Patient agrees with plan to proceed with steroid injection -A second steroid injection was performed at right dorsal midfoot using 1% plain Lidocaine and 10 mg of Kenalog. This was well tolerated.   No follow-ups on file.

## 2021-05-05 ENCOUNTER — Telehealth: Payer: Self-pay

## 2021-05-05 DIAGNOSIS — Z3041 Encounter for surveillance of contraceptive pills: Secondary | ICD-10-CM

## 2021-05-05 MED ORDER — NORETHIN-ETH ESTRAD-FE BIPHAS 1 MG-10 MCG / 10 MCG PO TABS: 1.0000 | ORAL_TABLET | Freq: Every day | ORAL | 0 refills | Status: DC

## 2021-05-05 NOTE — Telephone Encounter (Signed)
Pt calling for refill of bc to get her to her appt; will run out before appt.; has new ins and needs diff pharm.  [phone]  Pt states new pharm should be [facility]; pt call to be sure info in system is the correct location etc.  Address is the same; diff fax #  - [phone] Adv refill would be eRx'd and they will contact her for ins info as we do not have correct info in her chart yet.

## 2021-05-07 ENCOUNTER — Other Ambulatory Visit: Payer: Self-pay | Admitting: Obstetrics and Gynecology

## 2021-05-07 DIAGNOSIS — Z3041 Encounter for surveillance of contraceptive pills: Secondary | ICD-10-CM

## 2021-05-07 MED ORDER — MICROGESTIN 24 FE 1-20 MG-MCG PO TABS: 1.0000 | ORAL_TABLET | Freq: Every day | ORAL | 0 refills | Status: DC

## 2021-05-07 NOTE — Telephone Encounter (Signed)
Pt aware BC Rx not covered by new insurance. Pt is okay with sending different Rx. Wants to know if it will be the same as far as not taking the last 4 pills?

## 2021-05-07 NOTE — Progress Notes (Signed)
Rx lomedia eRxd since Lo Loestrin needs prior auth

## 2021-05-07 NOTE — Telephone Encounter (Signed)
Pt aware.

## 2021-05-07 NOTE — Telephone Encounter (Signed)
Rx lomedia eRxd. Same pill as Lo loestrin except slightly higher estrogen level

## 2021-05-15 ENCOUNTER — Ambulatory Visit: Payer: PRIVATE HEALTH INSURANCE | Admitting: Podiatry

## 2021-05-15 ENCOUNTER — Other Ambulatory Visit: Payer: Self-pay

## 2021-05-15 DIAGNOSIS — M778 Other enthesopathies, not elsewhere classified: Secondary | ICD-10-CM | POA: Diagnosis not present

## 2021-05-20 ENCOUNTER — Encounter: Payer: Self-pay | Admitting: Obstetrics and Gynecology

## 2021-05-20 ENCOUNTER — Ambulatory Visit (INDEPENDENT_AMBULATORY_CARE_PROVIDER_SITE_OTHER): Payer: PRIVATE HEALTH INSURANCE | Admitting: Obstetrics and Gynecology

## 2021-05-20 ENCOUNTER — Other Ambulatory Visit: Payer: Self-pay

## 2021-05-20 VITALS — BP 112/70 | Ht 65.0 in | Wt 198.0 lb

## 2021-05-20 DIAGNOSIS — Z3041 Encounter for surveillance of contraceptive pills: Secondary | ICD-10-CM

## 2021-05-20 DIAGNOSIS — Z01419 Encounter for gynecological examination (general) (routine) without abnormal findings: Secondary | ICD-10-CM

## 2021-05-20 DIAGNOSIS — Z1231 Encounter for screening mammogram for malignant neoplasm of breast: Secondary | ICD-10-CM

## 2021-05-20 NOTE — Progress Notes (Signed)
Subjective:  Patient ID: Alicia Woods, female    DOB: 1965/06/27,  MRN: [id number]  Chief Complaint  Patient presents with   Foot Pain    Pt stated that she is doing better     56 y.o. female presents with the above complaint.  Patient presents for follow-up of right dorsal midfoot pain near the incision.  Patient states she is doing a lot better injection helped considerably.  She denies any other acute complaints.    Review of Systems: Negative except as noted in the HPI. Denies N/V/F/Ch.  Past Medical History:  Diagnosis Date   Esophageal reflux    Neck pain on left side    CAUSED BY CYST AND CAUSES SHOULDER PAIN;  DR. Sharlet Salina    Current Outpatient Medications:    Calcium Citrate 250 MG TABS, Take 1 capsule daily by mouth., Disp: , Rfl:    Cholecalciferol (VITAMIN D3) 2000 units capsule, Take 1 capsule daily by mouth., Disp: , Rfl:    cyclobenzaprine (FLEXERIL) 10 MG tablet, 1/2-1 po qHS prn, Disp: , Rfl:    diphenhydrAMINE (BENADRYL) 25 mg capsule, Take 1-2 tabs PO QHS, Disp: , Rfl:    fluticasone (FLONASE) 50 MCG/ACT nasal spray, Place into the nose., Disp: , Rfl:    gabapentin (NEURONTIN) 300 MG capsule, Take 1 capsule daily by mouth., Disp: , Rfl:    ibuprofen (ADVIL) 800 MG tablet, TAKE 1 TABLET BY MOUTH EVERY 6 HOURS AS NEEDED., Disp: 60 tablet, Rfl: 1   loratadine (CLARITIN) 10 MG tablet, Take by mouth., Disp: , Rfl:    meloxicam (MOBIC) 15 MG tablet, Take 15 mg by mouth daily., Disp: , Rfl:    Norethindrone Acetate-Ethinyl Estrad-FE (MICROGESTIN 24 FE) 1-20 MG-MCG(24) tablet, Take 1 tablet by mouth daily., Disp: 84 tablet, Rfl: 0   omeprazole (PRILOSEC) 20 MG capsule, Take 1 capsule daily by mouth., Disp: , Rfl:    oxyCODONE-acetaminophen (PERCOCET) 5-325 MG tablet, Take 1 tablet by mouth every 4 (four) hours as needed for severe pain., Disp: 30 tablet, Rfl: 0   oxyCODONE-acetaminophen (PERCOCET) 5-325 MG tablet, Take 1 tablet by mouth every 4 (four) hours as needed  for severe pain., Disp: 30 tablet, Rfl: 0   Potassium 99 MG TABS, Take 1 tablet daily by mouth., Disp: , Rfl:    triamcinolone ointment (KENALOG) 0.1 %, Apply topically 2 (two) times daily., Disp: , Rfl:    VITAMIN B COMPLEX-C CAPS, Take 1 capsule daily by mouth., Disp: , Rfl:   Social History   Tobacco Use  Smoking Status Former  Smokeless Tobacco Never  Tobacco Comments   quit 2009    Allergies  Allergen Reactions   Ciprofloxacin Hives   Ciprocinonide [Fluocinolone] Itching   Robitussin (Alcohol Free) [Guaifenesin] Swelling   Penicillins Rash   Objective:  There were no vitals filed for this visit. There is no height or weight on file to calculate BMI. Constitutional Well developed. Well nourished.  Vascular Dorsalis pedis pulses palpable bilaterally. Posterior tibial pulses palpable bilaterally. Capillary refill normal to all digits.  No cyanosis or clubbing noted. Pedal hair growth normal.  Neurologic Normal speech. Oriented to person, place, and time. Epicritic sensation to light touch grossly present bilaterally.  Dermatologic Nails well groomed and normal in appearance. No open wounds. No skin lesions.  Orthopedic: No further pain on palpation right dorsal midfoot.  No pain at the previous surgical mark.  Minimal scarring noted at the previous surgical site.   Radiographs: None Assessment:  1. Capsulitis of foot, right      Plan:  Patient was evaluated and treated and all questions answered.  Right dorsal midfoot capsulitis to the distal part of the incision -Clinically resolved with steroid injection.  The size and the depth of the inflammation has gone down.  I discussed shoe gear modification and taking the pressure away from the incision sites.  She states understanding.  If any foot and ankle issues arise future of asked her to come back and see me..   No follow-ups on file.

## 2021-05-20 NOTE — Patient Instructions (Addendum)
I value your feedback and you entrusting us with your care. If you get a [facility] patient survey, I would appreciate you taking the time to let us know about your experience today. Thank you!  [facility]: [phone]      

## 2021-05-20 NOTE — Progress Notes (Signed)
PCP:  Alicia Pink, MD   Chief Complaint  Patient presents with   Gynecologic Exam    No concerns     HPI:      [name] is a 56 y.o. No obstetric history on file. who LMP was No LMP recorded. (Menstrual status: Oral contraceptives)., presents today for her annual examination.  Her menses are absent with cont dosing Lo Loestrin OCPs. She tried doing placebo pills 2019 and had withdrawal bleed. Did placebo pills a month or so ago with a few days light spotting. On lomedia now due to insurance. Minimal vasomotor sx.   Sex activity: single partner, contraception - OCP (estrogen/progesterone). Has vaginal dryness, improved with lubricants.  Last Pap: 05/16/20 Results were: no abnormalities /neg HPV DNA  Hx of STDs: none  Last mammogram: 01/14/21  Results were: normal--routine follow-up in 12 months There is a FH of breast cancer in her MGM, age unkown. There is no FH of ovarian cancer. The patient does not do self-breast exams.  Tobacco use: The patient denies current or previous tobacco use. Alcohol use: none No drug use.  Exercise: min active Pt gaining wt. Eating late at night, may be stress related, and not exercising.  Colonoscopy: 2019 with Alicia Woods,  with polyp; repeat after 10 yrs.  She does get adequate calcium and Vitamin D in her diet.  Labs with PCP.   Past Medical History:  Diagnosis Date   Esophageal reflux    Neck pain on left side    CAUSED BY CYST AND CAUSES SHOULDER PAIN;  DR. Sharlet Woods    Past Surgical History:  Procedure Laterality Date   ANKLE SURGERY Left    BACK SURGERY     COLONOSCOPY WITH PROPOFOL N/A 03/07/2018   Procedure: COLONOSCOPY WITH PROPOFOL;  Surgeon: Alicia Landsman, MD;  Location: [facility] ENDOSCOPY;  Service: Gastroenterology;  Laterality: N/A;   SPINAL FUSION     WISDOM TOOTH EXTRACTION     TWO    Family History  Problem Relation Age of Onset   Diabetes Mother        Type 1   Breast cancer Maternal Grandmother         age unknown   Other Father        Polio   Muscular dystrophy Sister     Social History   Socioeconomic History   Marital status: Married    Spouse name: Not on file   Number of children: Not on file   Years of education: Not on file   Highest education level: Not on file  Occupational History   Not on file  Tobacco Use   Smoking status: Former   Smokeless tobacco: Never   Tobacco comments:    quit 2009  Vaping Use   Vaping Use: Never used  Substance and Sexual Activity   Alcohol use: Yes    Comment: social    Drug use: No   Sexual activity: Yes    Birth control/protection: Post-menopausal  Other Topics Concern   Not on file  Social History Narrative   Not on file   Social Determinants of Health   Financial Resource Strain: Not on file  Food Insecurity: Not on file  Transportation Needs: Not on file  Physical Activity: Not on file  Stress: Not on file  Social Connections: Not on file  Intimate Partner Violence: Not on file    Current Meds  Medication Sig   Calcium Citrate 250 MG TABS Take 1  capsule daily by mouth.   Cholecalciferol (VITAMIN D3) 2000 units capsule Take 1 capsule daily by mouth.   diphenhydrAMINE (BENADRYL) 25 mg capsule Take 1-2 tabs PO QHS   gabapentin (NEURONTIN) 300 MG capsule Take 1 capsule daily by mouth.   ibuprofen (ADVIL) 800 MG tablet TAKE 1 TABLET BY MOUTH EVERY 6 HOURS AS NEEDED.   Norethindrone Acetate-Ethinyl Estrad-FE (MICROGESTIN 24 FE) 1-20 MG-MCG(24) tablet Take 1 tablet by mouth daily.   omeprazole (PRILOSEC) 20 MG capsule Take 1 capsule daily by mouth.   Potassium 99 MG TABS Take 1 tablet daily by mouth.   triamcinolone ointment (KENALOG) 0.1 % Apply topically 2 (two) times daily.   VITAMIN B COMPLEX-C CAPS Take 1 capsule daily by mouth.     ROS:  Review of Systems  Constitutional:  Negative for fatigue, fever and unexpected weight change.  Respiratory:  Negative for cough, shortness of breath and wheezing.    Cardiovascular:  Negative for chest pain, palpitations and leg swelling.  Gastrointestinal:  Negative for blood in stool, constipation, diarrhea, nausea and vomiting.  Endocrine: Negative for cold intolerance, heat intolerance and polyuria.  Genitourinary:  Negative for dyspareunia, dysuria, flank pain, frequency, genital sores, hematuria, menstrual problem, pelvic pain, urgency, vaginal bleeding, vaginal discharge and vaginal pain.  Musculoskeletal:  Negative for back pain, joint swelling and myalgias.  Skin:  Negative for rash.  Neurological:  Negative for dizziness, syncope, light-headedness, numbness and headaches.  Hematological:  Negative for adenopathy.  Psychiatric/Behavioral:  Negative for agitation, confusion, sleep disturbance and suicidal ideas. The patient is not nervous/anxious.     Objective: BP 112/70    Ht 5\' 5"  (1.651 m)    Wt 198 lb (89.8 kg)    BMI 32.95 kg/m    Physical Exam Constitutional:      Appearance: She is well-developed.  Genitourinary:     Vulva normal.     Right Labia: No rash, tenderness or lesions.    Left Labia: No tenderness, lesions or rash.    No vaginal discharge, erythema or tenderness.      Right Adnexa: not tender and no mass present.    Left Adnexa: not tender and no mass present.    No cervical friability or polyp.     Uterus is not enlarged or tender.  Breasts:    Right: No mass, nipple discharge, skin change or tenderness.     Left: No mass, nipple discharge, skin change or tenderness.  Neck:     Thyroid: No thyromegaly.  Cardiovascular:     Rate and Rhythm: Normal rate and regular rhythm.     Heart sounds: Normal heart sounds. No murmur heard. Pulmonary:     Effort: Pulmonary effort is normal.     Breath sounds: Normal breath sounds.  Abdominal:     Palpations: Abdomen is soft.     Tenderness: There is no abdominal tenderness. There is no guarding or rebound.  Musculoskeletal:        General: Normal range of motion.      Cervical back: Normal range of motion.  Lymphadenopathy:     Cervical: No cervical adenopathy.  Neurological:     General: No focal deficit present.     Mental Status: She is alert and oriented to person, place, and time.     Cranial Nerves: No cranial nerve deficit.  Skin:    General: Skin is warm and dry.  Psychiatric:        Mood and Affect: Mood normal.  Behavior: Behavior normal.        Thought Content: Thought content normal.        Judgment: Judgment normal.  Vitals reviewed.    Assessment/Plan: Encounter for annual routine gynecological examination  Encounter for surveillance of contraceptive pills--will d/c and see what bleeding does. F/u prn. If restarts, can restart OCPs.   Encounter for screening mammogram for malignant neoplasm of breast - Plan: MM 3D SCREEN BREAST BILATERAL; pt to scheds mammo for 10/23   GYN counsel breast self exam, mammography screening, adequate intake of calcium and vitamin D, diet and exercise     F/U  Return in about 1 year (around 05/20/2022).  Catherin Doorn B. Marlaysia Lenig, PA-C 05/20/2021 3:39 PM

## 2021-06-19 ENCOUNTER — Ambulatory Visit: Payer: PRIVATE HEALTH INSURANCE | Admitting: Podiatry

## 2021-06-19 ENCOUNTER — Other Ambulatory Visit: Payer: Self-pay

## 2021-06-19 DIAGNOSIS — D219 Benign neoplasm of connective and other soft tissue, unspecified: Secondary | ICD-10-CM | POA: Diagnosis not present

## 2021-06-19 DIAGNOSIS — M778 Other enthesopathies, not elsewhere classified: Secondary | ICD-10-CM | POA: Diagnosis not present

## 2021-06-24 NOTE — Progress Notes (Signed)
?Subjective:  ?Patient ID: Alicia Woods, female    DOB: 01/06/1966,  MRN: [id number] ? ?Chief Complaint  ?Patient presents with  ? Foot Pain  ?  Still having pain with right foot   ? ? ?56 y.o. female presents with the above complaint.  Patient presents with right dorsal midfoot pain.  She states that she is noticing a small knot/fibroma.  She states she gets a shooting tingling down the foot when it gets touched.  There are some discomfort.  She has tried shoe gear modification.  She denies any other acute complaints. ? ? ?Review of Systems: Negative except as noted in the HPI. Denies N/V/F/Ch. ? ?Past Medical History:  ?Diagnosis Date  ? Esophageal reflux   ? Neck pain on left side   ? CAUSED BY CYST AND CAUSES SHOULDER PAIN;  DR. Sharlet Salina  ? ? ?Current Outpatient Medications:  ?  Calcium Citrate 250 MG TABS, Take 1 capsule daily by mouth., Disp: , Rfl:  ?  Cholecalciferol (VITAMIN D3) 2000 units capsule, Take 1 capsule daily by mouth., Disp: , Rfl:  ?  diphenhydrAMINE (BENADRYL) 25 mg capsule, Take 1-2 tabs PO QHS, Disp: , Rfl:  ?  fluticasone (FLONASE) 50 MCG/ACT nasal spray, Place into the nose., Disp: , Rfl:  ?  gabapentin (NEURONTIN) 300 MG capsule, Take 1 capsule daily by mouth., Disp: , Rfl:  ?  ibuprofen (ADVIL) 800 MG tablet, TAKE 1 TABLET BY MOUTH EVERY 6 HOURS AS NEEDED., Disp: 60 tablet, Rfl: 1 ?  loratadine (CLARITIN) 10 MG tablet, Take by mouth., Disp: , Rfl:  ?  Norethindrone Acetate-Ethinyl Estrad-FE (MICROGESTIN 24 FE) 1-20 MG-MCG(24) tablet, Take 1 tablet by mouth daily., Disp: 84 tablet, Rfl: 0 ?  omeprazole (PRILOSEC) 20 MG capsule, Take 1 capsule daily by mouth., Disp: , Rfl:  ?  Potassium 99 MG TABS, Take 1 tablet daily by mouth., Disp: , Rfl:  ?  triamcinolone ointment (KENALOG) 0.1 %, Apply topically 2 (two) times daily., Disp: , Rfl:  ?  VITAMIN B COMPLEX-C CAPS, Take 1 capsule daily by mouth., Disp: , Rfl:  ? ?Social History  ? ?Tobacco Use  ?Smoking Status Former  ?Smokeless Tobacco  Never  ?Tobacco Comments  ? quit 2009  ? ? ?Allergies  ?Allergen Reactions  ? Ciprofloxacin Hives  ? Ciprocinonide [Fluocinolone] Itching  ? Robitussin (Alcohol Free) [Guaifenesin] Swelling  ? Penicillins Rash  ? ?Objective:  ?There were no vitals filed for this visit. ?There is no height or weight on file to calculate BMI. ?Constitutional Well developed. ?Well nourished.  ?Vascular Dorsalis pedis pulses palpable bilaterally. ?Posterior tibial pulses palpable bilaterally. ?Capillary refill normal to all digits.  ?No cyanosis or clubbing noted. ?Pedal hair growth normal.  ?Neurologic Normal speech. ?Oriented to person, place, and time. ?Epicritic sensation to light touch grossly present bilaterally.  ?Dermatologic Nails well groomed and normal in appearance. ?No open wounds. ?No skin lesions.  ?Orthopedic: Hard indurated small fibroma noted to the dorsal foot.  Pain on palpation.  Positive nerve compression noted with Tinel's sign to the distal toes.  The cyst is likely pressing on the nerve.  ? ?Radiographs: None ?Assessment:  ? ?1. Capsulitis of foot, right   ?2. Fibroma   ? ? ? ?Plan:  ?Patient was evaluated and treated and all questions answered. ? ?Right dorsal midfoot capsulitis to the distal part of the incision ?-I discussed with the patient given that the fibroma may be pressing against the nerve it may be causing the  tingling sensation that she is experiencing.  For now I discussed surgical options were I go back in and revise and remove the knot/fibroma and decompress the nerve.  However she would like to hold off on surgical intervention for now and she will come and see me when she is ready. ?-She will use lidocaine patch to help with the nerve compression ? ?No follow-ups on file.  ? ?

## 2021-07-11 ENCOUNTER — Encounter: Payer: Self-pay | Admitting: Obstetrics and Gynecology

## 2021-07-14 ENCOUNTER — Other Ambulatory Visit: Payer: Self-pay | Admitting: Obstetrics and Gynecology

## 2021-07-14 DIAGNOSIS — Z3041 Encounter for surveillance of contraceptive pills: Secondary | ICD-10-CM

## 2021-07-14 MED ORDER — MICROGESTIN 24 FE 1-20 MG-MCG PO TABS: 1.0000 | ORAL_TABLET | Freq: Every day | ORAL | 1 refills | Status: DC

## 2021-07-14 NOTE — Progress Notes (Signed)
Rx RF lomedia to mail order. Had withdrawal bleed stopping Ocps. Retry in 6 months ?

## 2021-10-21 ENCOUNTER — Ambulatory Visit: Payer: PRIVATE HEALTH INSURANCE | Admitting: Podiatry

## 2021-10-21 DIAGNOSIS — M67471 Ganglion, right ankle and foot: Secondary | ICD-10-CM | POA: Diagnosis not present

## 2021-10-21 DIAGNOSIS — D219 Benign neoplasm of connective and other soft tissue, unspecified: Secondary | ICD-10-CM | POA: Diagnosis not present

## 2021-10-23 NOTE — Progress Notes (Signed)
Subjective:  Patient ID: Alicia Woods, female    DOB: [birth year],  MRN: [id number]  Chief Complaint  Patient presents with   Foot Pain    56 y.o. female presents with the above complaint.  Patient presents with right soft tissue mass on the dorsal foot near the previous incision.  Patient states very painful to touch and hurts right in that area.  She states it hurts with ambulation she denies any other acute complaints.  Hurts with pressure.  She would like to know if he can have it aspirated.  She does not want to surgically excise it out yet if she can hold off of surgery.   Review of Systems: Negative except as noted in the HPI. Denies N/V/F/Ch.  Past Medical History:  Diagnosis Date   Esophageal reflux    Neck pain on left side    CAUSED BY CYST AND CAUSES SHOULDER PAIN;  DR. Sharlet Salina    Current Outpatient Medications:    Calcium Citrate 250 MG TABS, Take 1 capsule daily by mouth., Disp: , Rfl:    Cholecalciferol (VITAMIN D3) 2000 units capsule, Take 1 capsule daily by mouth., Disp: , Rfl:    diphenhydrAMINE (BENADRYL) 25 mg capsule, Take 1-2 tabs PO QHS, Disp: , Rfl:    fluticasone (FLONASE) 50 MCG/ACT nasal spray, Place into the nose., Disp: , Rfl:    gabapentin (NEURONTIN) 300 MG capsule, Take 1 capsule daily by mouth., Disp: , Rfl:    ibuprofen (ADVIL) 800 MG tablet, TAKE 1 TABLET BY MOUTH EVERY 6 HOURS AS NEEDED., Disp: 60 tablet, Rfl: 1   loratadine (CLARITIN) 10 MG tablet, Take by mouth., Disp: , Rfl:    Norethindrone Acetate-Ethinyl Estrad-FE (MICROGESTIN 24 FE) 1-20 MG-MCG(24) tablet, Take 1 tablet by mouth daily., Disp: 84 tablet, Rfl: 1   omeprazole (PRILOSEC) 20 MG capsule, Take 1 capsule daily by mouth., Disp: , Rfl:    Potassium 99 MG TABS, Take 1 tablet daily by mouth., Disp: , Rfl:    triamcinolone ointment (KENALOG) 0.1 %, Apply topically 2 (two) times daily., Disp: , Rfl:    VITAMIN B COMPLEX-C CAPS, Take 1 capsule daily by mouth., Disp: , Rfl:   Social  History   Tobacco Use  Smoking Status Former  Smokeless Tobacco Never  Tobacco Comments   quit 2009    Allergies  Allergen Reactions   Ciprofloxacin Hives   Ciprocinonide [Fluocinolone] Itching   Robitussin (Alcohol Free) [Guaifenesin] Swelling   Penicillins Rash   Objective:  There were no vitals filed for this visit. There is no height or weight on file to calculate BMI. Constitutional Well developed. Well nourished.  Vascular Dorsalis pedis pulses palpable bilaterally. Posterior tibial pulses palpable bilaterally. Capillary refill normal to all digits.  No cyanosis or clubbing noted. Pedal hair growth normal.  Neurologic Normal speech. Oriented to person, place, and time. Epicritic sensation to light touch grossly present bilaterally.  Dermatologic N clinically able to appreciate a hard nodule consistent with soft tissue mass/plantar fibroma.  May be a small area of ganglion cyst.  Pain on palpation to these mass  Orthopedic: Normal joint ROM without pain or crepitus bilaterally. No visible deformities. No bony tenderness.   Radiographs: None Assessment:   1. Fibroma   2. Ganglion cyst of right foot    Plan:  Patient was evaluated and treated and all questions answered.  Right dorsal soft tissue mass/cyst -All questions and concerns were discussed with the patient in extensive detail.  At this time  patient will benefit from aspiration of the cyst in order to distinguish ganglion cyst versus a fibroma.  Clinically it appears to be hard however we will attempt to aspirate. Aspiration of the ganglion cyst Skin was prepped with Betadine/alcohol prep.  One-to-one mixture 1% lidocaine plain half percent Marcaine plain was utilized to provide anesthesia.  18-gauge needle was used to penetrate the skin into the mass and was drained.  No gelatinous material was noted.  No drainage was noted.  Most fluid was serosanguineous. -At this time steroid injection was injected in an  attempt to provide local anti-inflammatory -If there is no improvement we will need to revisit surgical options.  Patient agrees with the plan  No follow-ups on file.

## 2021-12-01 ENCOUNTER — Other Ambulatory Visit: Payer: Self-pay | Admitting: Obstetrics and Gynecology

## 2021-12-01 DIAGNOSIS — Z3041 Encounter for surveillance of contraceptive pills: Secondary | ICD-10-CM

## 2021-12-03 ENCOUNTER — Telehealth: Payer: Self-pay

## 2021-12-03 NOTE — Telephone Encounter (Signed)
[employer] VOICEMAIL: Patient states she is out of birth control pills. [place] sent a request that was denied. Patient inquiring the reason for denial. Cb#[phone].

## 2021-12-03 NOTE — Telephone Encounter (Signed)
Spoke with patient. Advised reason for denial is due to most recent my chart encounter instructions from [place] (4/92/01)  Copland, Deirdre Evener, PA-C to [employer]       07/14/21 10:12 AM I sent in a refill for 6 months. Try the sugar pills again then and we'll see if you bleed lightly. If so, we'll keep you off your pills to see if your periods resume on their own or if it is the pills making you bleed. Elmo Putt  Last read by Newt Lukes at 10:26 AM on 07/14/2021.  We needed to get an update on how her bleeding has been. Patient reports she does not bleed on continuous dosing. She only bleeds if she takes the sugar pills. Advised will send message to [facility] for review. She is out of the office today and will return tomorrow. Patient has 18 days left of rx. She uses mail order pharmacy and placed her refill request early.

## 2021-12-05 ENCOUNTER — Encounter: Payer: Self-pay | Admitting: Obstetrics and Gynecology

## 2021-12-05 ENCOUNTER — Other Ambulatory Visit: Payer: Self-pay | Admitting: Obstetrics and Gynecology

## 2021-12-05 DIAGNOSIS — Z3041 Encounter for surveillance of contraceptive pills: Secondary | ICD-10-CM

## 2021-12-10 NOTE — Telephone Encounter (Signed)
Rx sent by Elmo Putt Copland [birth year]

## 2022-01-20 ENCOUNTER — Ambulatory Visit
Admission: RE | Admit: 2022-01-20 | Discharge: 2022-01-20 | Disposition: A | Discharge status: Home / Self Care | Payer: 59 | Source: Ambulatory Visit | Attending: Obstetrics and Gynecology | Admitting: Obstetrics and Gynecology

## 2022-01-20 DIAGNOSIS — Z1231 Encounter for screening mammogram for malignant neoplasm of breast: Secondary | ICD-10-CM | POA: Insufficient documentation

## 2022-02-12 ENCOUNTER — Ambulatory Visit: Payer: PRIVATE HEALTH INSURANCE | Admitting: Podiatry

## 2022-03-02 NOTE — Progress Notes (Unsigned)
Referring Physician:  Maryland Pink, MD [address] [place],  [place] [address]  Primary Physician:  Maryland Pink, MD  History of Present Illness: 03/05/2022 [name] has a history of hyperlipidemia, GERD, spinal stenosis, and lumbar HNP.   She saw Dr. Izora Ribas 04/22/17 and he recommended continued conservative care and injections. Has seen Dr. Sharlet Salina in past for her neck. Was last seen by Dr. Sharlet Salina on 12/23/20 for chronic neck pain that started in August of 2017. He recommended she call back after foot surgery to schedule MBB left C4-C5 and/or C2-C3 facet joints.   She has chronic neck pain x years as above. She has been worse in last 6 weeks with numbness/tingling in both hands (thumb, index, middle) and burning in right hand that wakes her up at night. These symptoms can last for hours and are worse at night.   She notes constant pain into her left shoulder along with neck pain. No radiation of pain into arms. Pain is worse with looking to the left and right. No weakness in hands/arms.    She does not smoke.   No bowel or bladder issues.    Conservative measures:  Physical therapy: years ago  Multimodal medical therapy including regular antiinflammatories: prednisone (no relief), motrin (minimal relief), neruontin, flexeril, mobic, ultram  Injections:  11/19/2020: Left C2-3 and C4-5 facet joint injections (improvement of pain located at the C2-3 level, no relief of the pain located at C4-5) 06/01/2019: Left C4-5 transforaminal ESI (little relief) 02/22/2018: Left trapezius ridge trigger point injection (little relief) 01/25/2018: Left C4-5 transforaminal ESI (good relief) 12/20/2017: Left C4-5 transforaminal ESI (mild to moderate relief) 01/29/2017: Left C4-5 facet joint injection (55% relief) 10/29/2016: Left C4-5 transforaminal ESI (80% relief) 07/28/2016: Left C4-5 transforaminal ESI (50-60% relief)   Past Surgery:  lumbar fusion L4-L5  05/2007  Norene L Woods has no symptoms of cervical myelopathy.  The symptoms are causing a significant impact on the patient's life.   Review of Systems:  A 10 point review of systems is negative, except for the pertinent positives and negatives detailed in the HPI.  Past Medical History: Past Medical History:  Diagnosis Date   Esophageal reflux    Neck pain on left side    CAUSED BY CYST AND CAUSES SHOULDER PAIN;  DR. Sharlet Salina    Past Surgical History: Past Surgical History:  Procedure Laterality Date   ANKLE SURGERY Left    BACK SURGERY     COLONOSCOPY WITH PROPOFOL N/A 03/07/2018   Procedure: COLONOSCOPY WITH PROPOFOL;  Surgeon: Lin Landsman, MD;  Location: [facility] ENDOSCOPY;  Service: Gastroenterology;  Laterality: N/A;   SPINAL FUSION     WISDOM TOOTH EXTRACTION     TWO    Allergies: Allergies as of 03/05/2022 - Review Complete 10/21/2021  Allergen Reaction Noted   Ciprofloxacin Hives    Ciprocinonide [fluocinolone] Itching 01/31/2013   Robitussin (alcohol free) [guaifenesin] Swelling 01/31/2013   Penicillins Rash 01/31/2013    Medications: Outpatient Encounter Medications as of 03/05/2022  Medication Sig   Calcium Citrate 250 MG TABS Take 1 capsule daily by mouth.   Cholecalciferol (VITAMIN D3) 2000 units capsule Take 1 capsule daily by mouth.   diphenhydrAMINE (BENADRYL) 25 mg capsule Take 1-2 tabs PO QHS   fluticasone (FLONASE) 50 MCG/ACT nasal spray Place into the nose.   gabapentin (NEURONTIN) 300 MG capsule Take 1 capsule daily by mouth.   ibuprofen (ADVIL) 800 MG tablet TAKE 1 TABLET BY MOUTH  EVERY 6 HOURS AS NEEDED.   loratadine (CLARITIN) 10 MG tablet Take by mouth.   Norethindrone Acetate-Ethinyl Estrad-FE (MICROGESTIN 24 FE) 1-20 MG-MCG(24) tablet Take 1 tablet by mouth daily.   omeprazole (PRILOSEC) 20 MG capsule Take 1 capsule daily by mouth.   Potassium 99 MG TABS Take 1 tablet daily by mouth.   triamcinolone ointment (KENALOG) 0.1 % Apply  topically 2 (two) times daily.   VITAMIN B COMPLEX-C CAPS Take 1 capsule daily by mouth.   No facility-administered encounter medications on file as of 03/05/2022.    Social History: Social History   Tobacco Use   Smoking status: Former   Smokeless tobacco: Never   Tobacco comments:    quit 2009  Vaping Use   Vaping Use: Never used  Substance Use Topics   Alcohol use: Yes    Comment: social    Drug use: No    Family Medical History: Family History  Problem Relation Age of Onset   Diabetes Mother        Type 1   Breast cancer Maternal Grandmother        age unknown   Other Father        Polio   Muscular dystrophy Sister     Physical Examination: There were no vitals filed for this visit.  General: Patient is well developed, well nourished, calm, collected, and in no apparent distress. Attention to examination is appropriate.  Respiratory: Patient is breathing without any difficulty.   NEUROLOGICAL:     Awake, alert, oriented to person, place, and time.  Speech is clear and fluent. Fund of knowledge is appropriate.   Cranial Nerves: Pupils equal round and reactive to light.  Facial tone is symmetric.    ROM of spine:  Limited ROM of cervical spine with mild pain  No abnormal lesions on exposed skin.   No posterior cervical tenderness. Mild left > right trapezial tenderness.   Strength: Side Biceps Triceps Deltoid Interossei Grip Wrist Ext. Wrist Flex.  R '5 5 5 5 5 5 5  '$ L '5 5 5 5 5 5 5   '$ Side Iliopsoas Quads Hamstring PF DF EHL  R '5 5 5 5 5 5  '$ L '5 5 5 5 5 5   '$ Reflexes are 2+ and symmetric at the biceps, triceps, brachioradialis, patella and achilles.   Hoffman's is absent.  Clonus is not present.   Bilateral upper and lower extremity sensation is intact to light touch.    No evidence of dysmetria noted.  She has negative tinels at wrist bilaterally.  She has negative phalens at wrist bilaterally.   Gait is normal.     Medical Decision  Making  Imaging: Cervical MRI dated 10/22/20:  FINDINGS: Alignment: Straightening with mild reversal of the normal cervical lordosis. Trace grade 1 anterolisthesis of C3 on C4 and C4 on C5, chronic and facet mediated.   Vertebrae: Vertebral body height maintained without acute or chronic fracture. Bone marrow signal intensity within normal limits. No worrisome osseous lesions. Mild discogenic reactive endplate change present about the C4-5 through C6-7 interspaces. Prominent reactive marrow edema present about the left C2-3 facet due to facet arthritis (series 7, image 13).   Cord: Normal signal and morphology.   Posterior Fossa, vertebral arteries, paraspinal tissues: Visualized brain and posterior fossa within normal limits. Craniocervical junction within normal limits. Paraspinous and prevertebral soft tissues demonstrate no significant finding. Normal flow voids seen within the vertebral arteries bilaterally. Strongly dominant left vertebral artery noted.  1.1 cm heterogeneous left thyroid nodule, of doubtful significance given size and patient age, no follow-up imaging recommended (ref: J Am Coll Radiol. 2015 Feb;12(2): 143-50).   Disc levels:   C2-C3: Mild disc bulging flattens the ventral thecal sac. Moderate left with mild right facet hypertrophy, with associated reactive marrow edema on the left. No significant spinal stenosis. Foramina remain patent.   C3-C4: Trace anterolisthesis. Mild disc bulge with uncovertebral spurring. Mild left greater than right facet degeneration. No spinal stenosis. Foramina remain patent.   C4-C5: Anterolisthesis. Broad-based left paracentral disc osteophyte complex indents the ventral thecal sac. Mild flattening of the ventral cord without cord signal changes or significant spinal stenosis. Moderate left-sided facet degeneration. Foramina remain patent.   C5-C6: Degenerative intervertebral disc space narrowing with diffuse disc  osteophyte complex. Broad posterior component flattens and effaces the ventral thecal sac with resultant mild spinal stenosis. Right worse than left uncinate spurring with resultant moderate right and mild left C6 foraminal narrowing.   C6-C7: Degenerative intervertebral disc space narrowing with diffuse disc osteophyte complex. Broad posterior component flattens and partially faces the ventral thecal sac with resultant mild spinal stenosis. Moderate bilateral C7 foraminal stenosis.   C7-T1: Negative interspace. Mild left-sided facet hypertrophy. No canal or foraminal stenosis.   Visualized upper thoracic spine demonstrates no significant finding.   IMPRESSION: 1. Multilevel cervical spondylosis with resultant mild spinal stenosis at C5-6 and C6-7. Associated moderate right C6 and bilateral C7 foraminal stenosis. 2. Shallow left paracentral disc osteophyte at C4-5 with mild flattening of the left ventral cord. Finding could contribute to left-sided radicular symptoms. 3. Prominent reactive marrow edema about the left C2-3 facet due to facet arthritis. Finding could serve as a source for neck pain.     Electronically Signed   By: Jeannine Boga M.D.   On: 10/23/2020 21:26  I have personally reviewed the images and agree with the above interpretation.  Assessment and Plan: Ms. Ayo is a pleasant 55 y.o. female with chronic neck pain x years. She's had increased pain in last 6 weeks with intermittent numbness/tingling in both hands (thumb, index, middle) and burning in right hand that wakes her up at night. Also with constant neck pain that radiates into left shoulder. No radicular arm pain. No weakness.   She has known cervical spondylosis with trace slip at C3-C4 and C4-C5, mild central stenosis C5-C6 with moderate right/mild left foraminal stenosis, mild central stenosis C6-C7 with moderate bilateral foraminal stenosis.   Neck pain likely due to underlying spondylosis  with probable myofascial component. Numbness/tingling in bilateral hands may be cervical mediated or may be from carpal tunnel syndrome.   Treatment options discussed with patient and following plan made:   - MRI of cervical spine for persistent neck pain along with numbness/tingling in her hands. [facility] be done at [facility]  - EMG of bilateral upper extremities to evaluate numbness/tingling in bilateral hands (cervical radiculopathy versus carpal tunnel syndrome?). Order to [facility] in [place]. Can go to [facility] if needed.  - She has carpal tunnel splints at home. She will try wearing these at night.  - Discussed revisiting PT. Patient wants to hold off until further testing is done.  - Will set up phone visit to review MRI and EMG results.   I spent a total of 30 minutes in face-to-face and non-face-to-face activities related to this patient's care toda including review of outside records, review of imaging, review of symptoms, physical exam, discussion of differential diagnosis, discussion of treatment  options, and documentation.   Thank you for involving me in the care of this patient.   Geronimo Boot PA-C Dept. of Neurosurgery

## 2022-03-05 ENCOUNTER — Encounter: Payer: Self-pay | Admitting: Orthopedic Surgery

## 2022-03-05 ENCOUNTER — Telehealth: Payer: Self-pay

## 2022-03-05 ENCOUNTER — Ambulatory Visit (INDEPENDENT_AMBULATORY_CARE_PROVIDER_SITE_OTHER): Payer: PRIVATE HEALTH INSURANCE | Admitting: Orthopedic Surgery

## 2022-03-05 VITALS — BP 149/78 | HR 69 | Ht 65.0 in | Wt 168.8 lb

## 2022-03-05 DIAGNOSIS — R200 Anesthesia of skin: Secondary | ICD-10-CM

## 2022-03-05 DIAGNOSIS — R202 Paresthesia of skin: Secondary | ICD-10-CM | POA: Diagnosis not present

## 2022-03-05 DIAGNOSIS — M542 Cervicalgia: Secondary | ICD-10-CM | POA: Diagnosis not present

## 2022-03-05 DIAGNOSIS — M47812 Spondylosis without myelopathy or radiculopathy, cervical region: Secondary | ICD-10-CM | POA: Diagnosis not present

## 2022-03-05 NOTE — Telephone Encounter (Signed)
Please send EMG order to [facility] in [place]

## 2022-03-05 NOTE — Patient Instructions (Signed)
It was so nice to see you today, I am sorry that you are hurting so much.   You have some wear in tear in your neck and this is likely causing the neck pain. The numbness/tingling/burning in your hands may be from your neck or could be carpal tunnel syndrome.   I want to get an MRI of your neck to look into things further. We will call you to set this up at the hospital.    I want to get an EMG (nerve conduction test) to look into things further. [facility] in [place] will call you to schedule this.   Once we have the results, we will call you to schedule a phone visit with me.   Please do not hesitate to call if you have any questions or concerns. You can also message me in [place].   If you have not heard back about any of the tests/procedures in the next week, please call the office so we can help you get these things scheduled.   Geronimo Boot PA-C [phone]

## 2022-03-06 NOTE — Telephone Encounter (Signed)
Referral faxed to [facility].  ?

## 2022-03-16 ENCOUNTER — Ambulatory Visit
Admission: RE | Admit: 2022-03-16 | Discharge: 2022-03-16 | Disposition: A | Discharge status: Home / Self Care | Payer: 59 | Source: Ambulatory Visit | Attending: Orthopedic Surgery | Admitting: Orthopedic Surgery

## 2022-03-16 DIAGNOSIS — R202 Paresthesia of skin: Secondary | ICD-10-CM | POA: Insufficient documentation

## 2022-03-16 DIAGNOSIS — M47812 Spondylosis without myelopathy or radiculopathy, cervical region: Secondary | ICD-10-CM | POA: Insufficient documentation

## 2022-03-16 DIAGNOSIS — M542 Cervicalgia: Secondary | ICD-10-CM | POA: Diagnosis present

## 2022-03-16 DIAGNOSIS — R200 Anesthesia of skin: Secondary | ICD-10-CM | POA: Insufficient documentation

## 2022-03-17 NOTE — Telephone Encounter (Signed)
[id number]

## 2022-03-17 NOTE — Telephone Encounter (Signed)
04/22/2022 

## 2022-03-19 ENCOUNTER — Ambulatory Visit: Payer: PRIVATE HEALTH INSURANCE | Admitting: Orthopedic Surgery

## 2022-03-19 NOTE — Telephone Encounter (Signed)
Please get her EMG results from [facility] on 04/23/22 and let me know so we can schedule her a follow up.

## 2022-03-31 ENCOUNTER — Telehealth: Payer: Self-pay | Admitting: *Deleted

## 2022-03-31 NOTE — Telephone Encounter (Signed)
"  I wonder if I can get a copy of my medical records.  Give me a call and let me know how to do that."

## 2022-04-11 ENCOUNTER — Other Ambulatory Visit: Payer: Self-pay | Admitting: Orthopaedic Surgery

## 2022-04-11 DIAGNOSIS — M79671 Pain in right foot: Secondary | ICD-10-CM

## 2022-04-23 NOTE — Telephone Encounter (Signed)
Please get results of EMG done at [facility]. It was done today.   Once she responds, you can schedule her for followup- can do phone or in person, whichever she prefers.

## 2022-04-27 NOTE — Progress Notes (Unsigned)
Telephone Visit- Progress Note: Referring Physician:  No referring provider defined for this encounter.  Primary Physician:  Maryland Pink, MD  This visit was performed via telephone.  Patient location: home Provider location: office  I spent a total of 20 minutes non-face-to-face activities for this visit on the date of this encounter including review of current clinical condition and response to treatment.    Patient has given verbal consent to this telephone visits and we reviewed the limitations of a telephone visit. Patient wishes to proceed.    Chief Complaint:  review cervical MRI and EMG  History of Present Illness: History of Present Illness:  Ms. Ladina Shutters has a history of hyperlipidemia, GERD, spinal stenosis, and lumbar HNP.   Last seen by me on 03/05/22 for  chronic neck pain x years. She's had increased pain in last 6 weeks with intermittent numbness/tingling in both hands (thumb, index, middle) and burning in right hand that wakes her up at night.   She has known cervical spondylosis with trace slip at C3-C4 and C4-C5, mild central stenosis C5-C6 with moderate right/mild left foraminal stenosis, mild central stenosis C6-C7 with moderate bilateral foraminal stenosis.   I ordered updated cervical MRI and EMG of bilateral upper extremities. She was to try carpal tunnel splints at night.   She is here to discuss results.   Most of her current pain is in her shoulder/trapezial region. Her neck pain is not that bad. She says her husband can feel a knot in the muscle. Had some relief with OTC muscle rub. She is still having numbness and tingling in both hands. Wore carpal tunnel splints last night for the first time and numbness and tingling was much improved. No radicular arm pain.    Conservative measures:  Physical therapy: years ago  Multimodal medical therapy including regular antiinflammatories: prednisone (no relief), motrin (minimal relief), neruontin,  flexeril, mobic, ultram  Injections:  11/19/2020: Left C2-3 and C4-5 facet joint injections (improvement of pain located at the C2-3 level, no relief of the pain located at C4-5) 06/01/2019: Left C4-5 transforaminal ESI (little relief) 02/22/2018: Left trapezius ridge trigger point injection (little relief) 01/25/2018: Left C4-5 transforaminal ESI (good relief) 12/20/2017: Left C4-5 transforaminal ESI (mild to moderate relief) 01/29/2017: Left C4-5 facet joint injection (55% relief) 10/29/2016: Left C4-5 transforaminal ESI (80% relief) 07/28/2016: Left C4-5 transforaminal ESI (50-60% relief)   Past Surgery:  lumbar fusion L4-L5 05/2007  Julianne L Vessels has no symptoms of cervical myelopathy.  The symptoms are causing a significant impact on the patient's life.   Exam: No exam done as this was a telephone encounter.     Imaging: Cervical MRI scan dated 03/16/22:  FINDINGS: Alignment: Mild degenerative anterolisthesis at C2-3 to C4-5.   Vertebrae: No fracture, evidence of discitis, or bone lesion.   Cord: Normal signal and morphology.   Posterior Fossa, vertebral arteries, paraspinal tissues: Negative.   Disc levels:   C2-3: Advanced facet osteoarthritis asymmetric to the left. Mild anterolisthesis. Mild left foraminal narrowing based on axial images   C3-4: Degenerative facet spurring asymmetric to the left with mild marrow edema. Mild anterolisthesis.   C4-5: Degenerative facet spurring asymmetric to the left with mild anterolisthesis. Mild disc bulging   C5-6: Disc narrowing with endplate and uncovertebral ridging. Asymmetric right uncovertebral ridging with right foraminal stenosis. Disc osteophyte complex contacts the ventral cord   C6-7: Disc narrowing and endplate degeneration with foraminal bulging. Mild bilateral foraminal stenosis.   C7-T1:Unremarkable.   IMPRESSION:  1. Multilevel cervical spine degeneration without progression since 2022. 2. Notable  left upper facet osteoarthritis at C2-3 to C4-5 with anterolisthesis and active marrow edema on the left at C3-4. 3. C5-6 degenerative right foraminal impingement and mild spinal stenosis.     Electronically Signed   By: Jorje Guild M.D.   On: 03/17/2022 19:24  I have personally reviewed the images and agree with the above interpretation.  EMG of bilateral upper extremities dated 04/23/22:  There is electrodiagnostic evidence of mild median mononeuropathies at both wrists, worse on the right, as can be seen in carpal tunnel syndrome. There is no evidence of ulnar mononeuropathy or active cervical radiculopathy.   EMG report scanned into her chart under media.   Assessment and Plan: Ms. Petraitis is a pleasant 57 y.o. female with history of chronic neck pain. Most of her current pain is in her shoulder/trapezial region. Her neck pain is not currently that bad.  MRI shows diffuse cervical spondylosis with mild central stenosis at C5-C6. Neck pain likely is multifactorial.   She is still having numbness and tingling in both hands. Wore carpal tunnel splints last night for the first time and numbness and tingling was much improved.   EMG shows mild bilateral carpal tunnel syndrome, right > left.   Treatment options discussed with patient and following plan made:   - New prescription for robaxin to use prn. She understand this can make her sleepy. Reviewed dosing and side effects.  - Recommend PT for cervical spine for myofascial release/trigger points. She declines for now.  - Recommend she continue with carpal tunnel splints at night.  - Recommend she follow up with ortho for carpal tunnel. She wants to hold off on this.  - She will try the robaxin and having her husband do some local massage. Will message her in 2 weeks to check on her progress.   Geronimo Boot PA-C Neurosurgery

## 2022-04-29 NOTE — Telephone Encounter (Signed)
Results scanned under media.

## 2022-04-30 ENCOUNTER — Encounter: Payer: Self-pay | Admitting: Orthopedic Surgery

## 2022-04-30 ENCOUNTER — Ambulatory Visit (INDEPENDENT_AMBULATORY_CARE_PROVIDER_SITE_OTHER): Payer: PRIVATE HEALTH INSURANCE | Admitting: Orthopedic Surgery

## 2022-04-30 DIAGNOSIS — M542 Cervicalgia: Secondary | ICD-10-CM

## 2022-04-30 DIAGNOSIS — G5603 Carpal tunnel syndrome, bilateral upper limbs: Secondary | ICD-10-CM

## 2022-04-30 DIAGNOSIS — M47812 Spondylosis without myelopathy or radiculopathy, cervical region: Secondary | ICD-10-CM | POA: Diagnosis not present

## 2022-04-30 DIAGNOSIS — M7918 Myalgia, other site: Secondary | ICD-10-CM

## 2022-04-30 MED ORDER — METHOCARBAMOL 500 MG PO TABS: 500.0000 mg | ORAL_TABLET | Freq: Three times a day (TID) | ORAL | 0 refills | Status: DC | PRN

## 2022-05-06 ENCOUNTER — Other Ambulatory Visit: Payer: Self-pay | Admitting: Orthopaedic Surgery

## 2022-05-06 DIAGNOSIS — M25572 Pain in left ankle and joints of left foot: Secondary | ICD-10-CM

## 2022-05-06 DIAGNOSIS — M79671 Pain in right foot: Secondary | ICD-10-CM

## 2022-05-11 ENCOUNTER — Other Ambulatory Visit: Payer: PRIVATE HEALTH INSURANCE

## 2022-05-23 ENCOUNTER — Ambulatory Visit
Admission: RE | Admit: 2022-05-23 | Discharge: 2022-05-23 | Disposition: A | Discharge status: Home / Self Care | Payer: PRIVATE HEALTH INSURANCE | Source: Ambulatory Visit | Attending: Orthopaedic Surgery | Admitting: Orthopaedic Surgery

## 2022-05-23 DIAGNOSIS — M79671 Pain in right foot: Secondary | ICD-10-CM

## 2022-05-23 DIAGNOSIS — M25572 Pain in left ankle and joints of left foot: Secondary | ICD-10-CM

## 2022-05-24 ENCOUNTER — Ambulatory Visit
Admission: RE | Admit: 2022-05-24 | Discharge: 2022-05-24 | Disposition: A | Discharge status: Home / Self Care | Payer: PRIVATE HEALTH INSURANCE | Source: Ambulatory Visit | Attending: Orthopaedic Surgery | Admitting: Orthopaedic Surgery

## 2022-05-25 NOTE — Progress Notes (Unsigned)
PCP:  Maryland Pink, MD   No chief complaint on file.    HPI:      [name] is a 57 y.o. No obstetric history on file. who LMP was No LMP recorded., presents today for her annual examination.  Her menses are absent with cont dosing Lo Loestrin OCPs. She tried doing placebo pills 2019 and had withdrawal bleed. Did placebo pills a month or so ago with a few days light spotting. On lomedia now due to insurance. Minimal vasomotor sx.   Sex activity: single partner, contraception - OCP (estrogen/progesterone). Has vaginal dryness, improved with lubricants.  Last Pap: 05/16/20 Results were: no abnormalities /neg HPV DNA  Hx of STDs: none  Last mammogram: 01/20/22  Results were: normal--routine follow-up in 12 months There is a FH of breast cancer in her MGM, age unkown. There is no FH of ovarian cancer. The patient does not do self-breast exams.  Tobacco use: The patient denies current or previous tobacco use. Alcohol use: none No drug use.  Exercise: min active Pt gaining wt. Eating late at night, may be stress related, and not exercising.  Colonoscopy: 2019 with Dr. Marius Ditch,  with polyp; repeat after 10 yrs.  She does get adequate calcium and Vitamin D in her diet.  Labs with PCP.   Past Medical History:  Diagnosis Date   Esophageal reflux    Neck pain on left side    CAUSED BY CYST AND CAUSES SHOULDER PAIN;  DR. Sharlet Salina    Past Surgical History:  Procedure Laterality Date   ANKLE SURGERY Left    COLONOSCOPY WITH PROPOFOL N/A 03/07/2018   Procedure: COLONOSCOPY WITH PROPOFOL;  Surgeon: Lin Landsman, MD;  Location: [facility];  Service: Gastroenterology;  Laterality: N/A;   SPINAL FUSION     WISDOM TOOTH EXTRACTION     TWO    Family History  Problem Relation Age of Onset   Diabetes Mother        Type 1   Breast cancer Maternal Grandmother        age unknown   Other Father        Polio   Muscular dystrophy Sister     Social History    Socioeconomic History   Marital status: Married    Spouse name: Not on file   Number of children: Not on file   Years of education: Not on file   Highest education level: Not on file  Occupational History   Not on file  Tobacco Use   Smoking status: Former   Smokeless tobacco: Never   Tobacco comments:    quit 2009  Vaping Use   Vaping Use: Never used  Substance and Sexual Activity   Alcohol use: Yes    Comment: social    Drug use: No   Sexual activity: Yes    Birth control/protection: Post-menopausal  Other Topics Concern   Not on file  Social History Narrative   Not on file   Social Determinants of Health   Financial Resource Strain: Not on file  Food Insecurity: Not on file  Transportation Needs: Not on file  Physical Activity: Not on file  Stress: Not on file  Social Connections: Not on file  Intimate Partner Violence: Not on file    No outpatient medications have been marked as taking for the 05/26/22 encounter (Appointment) with Anil Havard, Elmo Putt B, PA-C.     ROS:  Review of Systems  Constitutional:  Negative for fatigue, fever and  unexpected weight change.  Respiratory:  Negative for cough, shortness of breath and wheezing.   Cardiovascular:  Negative for chest pain, palpitations and leg swelling.  Gastrointestinal:  Negative for blood in stool, constipation, diarrhea, nausea and vomiting.  Endocrine: Negative for cold intolerance, heat intolerance and polyuria.  Genitourinary:  Negative for dyspareunia, dysuria, flank pain, frequency, genital sores, hematuria, menstrual problem, pelvic pain, urgency, vaginal bleeding, vaginal discharge and vaginal pain.  Musculoskeletal:  Negative for back pain, joint swelling and myalgias.  Skin:  Negative for rash.  Neurological:  Negative for dizziness, syncope, light-headedness, numbness and headaches.  Hematological:  Negative for adenopathy.  Psychiatric/Behavioral:  Negative for agitation, confusion, sleep  disturbance and suicidal ideas. The patient is not nervous/anxious.      Objective: There were no vitals taken for this visit.   Physical Exam Constitutional:      Appearance: She is well-developed.  Genitourinary:     Vulva normal.     Right Labia: No rash, tenderness or lesions.    Left Labia: No tenderness, lesions or rash.    No vaginal discharge, erythema or tenderness.      Right Adnexa: not tender and no mass present.    Left Adnexa: not tender and no mass present.    No cervical friability or polyp.     Uterus is not enlarged or tender.  Breasts:    Right: No mass, nipple discharge, skin change or tenderness.     Left: No mass, nipple discharge, skin change or tenderness.  Neck:     Thyroid: No thyromegaly.  Cardiovascular:     Rate and Rhythm: Normal rate and regular rhythm.     Heart sounds: Normal heart sounds. No murmur heard. Pulmonary:     Effort: Pulmonary effort is normal.     Breath sounds: Normal breath sounds.  Abdominal:     Palpations: Abdomen is soft.     Tenderness: There is no abdominal tenderness. There is no guarding or rebound.  Musculoskeletal:        General: Normal range of motion.     Cervical back: Normal range of motion.  Lymphadenopathy:     Cervical: No cervical adenopathy.  Neurological:     General: No focal deficit present.     Mental Status: She is alert and oriented to person, place, and time.     Cranial Nerves: No cranial nerve deficit.  Skin:    General: Skin is warm and dry.  Psychiatric:        Mood and Affect: Mood normal.        Behavior: Behavior normal.        Thought Content: Thought content normal.        Judgment: Judgment normal.  Vitals reviewed.     Assessment/Plan: Encounter for annual routine gynecological examination  Encounter for surveillance of contraceptive pills--will d/c and see what bleeding does. F/u prn. If restarts, can restart OCPs.   Encounter for screening mammogram for malignant  neoplasm of breast - Plan: MM 3D SCREEN BREAST BILATERAL; pt to scheds mammo for 10/23   GYN counsel breast self exam, mammography screening, adequate intake of calcium and vitamin D, diet and exercise     F/U  No follow-ups on file.  Janace Decker B. Ameah Chanda, PA-C 05/25/2022 7:42 PM

## 2022-05-26 ENCOUNTER — Encounter: Payer: Self-pay | Admitting: Obstetrics and Gynecology

## 2022-05-26 ENCOUNTER — Ambulatory Visit (INDEPENDENT_AMBULATORY_CARE_PROVIDER_SITE_OTHER): Payer: PRIVATE HEALTH INSURANCE | Admitting: Obstetrics and Gynecology

## 2022-05-26 VITALS — BP 110/70 | Ht 65.5 in | Wt 178.0 lb

## 2022-05-26 DIAGNOSIS — Z1231 Encounter for screening mammogram for malignant neoplasm of breast: Secondary | ICD-10-CM

## 2022-05-26 DIAGNOSIS — Z01419 Encounter for gynecological examination (general) (routine) without abnormal findings: Secondary | ICD-10-CM | POA: Diagnosis not present

## 2022-05-26 DIAGNOSIS — Z3041 Encounter for surveillance of contraceptive pills: Secondary | ICD-10-CM

## 2022-05-26 DIAGNOSIS — N951 Menopausal and female climacteric states: Secondary | ICD-10-CM

## 2022-05-26 NOTE — Patient Instructions (Signed)
I value your feedback and you entrusting us with your care. If you get a [facility] patient survey, I would appreciate you taking the time to let us know about your experience today. Thank you! ? ? ?

## 2023-02-03 ENCOUNTER — Ambulatory Visit
Admission: RE | Admit: 2023-02-03 | Discharge: 2023-02-03 | Disposition: A | Discharge status: Home / Self Care | Payer: Managed Care, Other (non HMO) | Source: Ambulatory Visit | Attending: Obstetrics and Gynecology | Admitting: Obstetrics and Gynecology

## 2023-02-03 DIAGNOSIS — Z1231 Encounter for screening mammogram for malignant neoplasm of breast: Secondary | ICD-10-CM | POA: Diagnosis present

## 2023-02-16 ENCOUNTER — Other Ambulatory Visit: Payer: Self-pay | Admitting: Sports Medicine

## 2023-02-16 DIAGNOSIS — M76891 Other specified enthesopathies of right lower limb, excluding foot: Secondary | ICD-10-CM

## 2023-03-04 ENCOUNTER — Ambulatory Visit
Admission: RE | Admit: 2023-03-04 | Discharge: 2023-03-04 | Disposition: A | Discharge status: Home / Self Care | Payer: Managed Care, Other (non HMO) | Source: Ambulatory Visit | Attending: Sports Medicine | Admitting: Sports Medicine

## 2023-03-04 DIAGNOSIS — M76891 Other specified enthesopathies of right lower limb, excluding foot: Secondary | ICD-10-CM

## 2023-05-11 ENCOUNTER — Other Ambulatory Visit: Payer: Self-pay | Admitting: Sports Medicine

## 2023-05-11 DIAGNOSIS — M25551 Pain in right hip: Secondary | ICD-10-CM

## 2023-05-11 DIAGNOSIS — W19XXXD Unspecified fall, subsequent encounter: Secondary | ICD-10-CM

## 2023-05-25 ENCOUNTER — Other Ambulatory Visit: Payer: Managed Care, Other (non HMO)

## 2023-05-27 ENCOUNTER — Ambulatory Visit
Admission: RE | Admit: 2023-05-27 | Discharge: 2023-05-27 | Disposition: A | Discharge status: Home / Self Care | Payer: Managed Care, Other (non HMO) | Source: Ambulatory Visit | Attending: Sports Medicine

## 2023-05-27 DIAGNOSIS — W19XXXD Unspecified fall, subsequent encounter: Secondary | ICD-10-CM

## 2023-05-27 DIAGNOSIS — M25551 Pain in right hip: Secondary | ICD-10-CM

## 2023-05-31 NOTE — Progress Notes (Unsigned)
 PCP:  Jerl Mina, MD   No chief complaint on file.    HPI:      [name] is a 58 y.o. No obstetric history on file. who LMP was No LMP recorded. (Menstrual status: Other)., presents today for her annual examination.  Her menses are absent since stopping OCPs 9/23, no vaginal bleeding since. Is having tolerable vasomotor sx.  Sex activity: single partner, contraception - withdrawal. Has vaginal dryness, improved with lubricants. Also with decreased libido; under a lot of stress. Last Pap: 05/16/20 Results were: no abnormalities /neg HPV DNA  Hx of STDs: none  Last mammogram: 02/03/23 Results were: normal--routine follow-up in 12 months There is a FH of breast cancer in her MGM, age unkown. There is no FH of ovarian cancer. The patient does not do self-breast exams.  Tobacco use: The patient denies current or previous tobacco use. Alcohol use: social No drug use.  Exercise: not active  Colonoscopy: 2019 with Dr. Allegra Lai, with polyp; repeat after 10 yrs.  She does get adequate calcium and Vitamin D in her diet.  Labs with PCP.   Past Medical History:  Diagnosis Date   Esophageal reflux    Neck pain on left side    CAUSED BY CYST AND CAUSES SHOULDER PAIN;  DR. Yves Dill    Past Surgical History:  Procedure Laterality Date   ANKLE SURGERY Left    COLONOSCOPY WITH PROPOFOL N/A 03/07/2018   Procedure: COLONOSCOPY WITH PROPOFOL;  Surgeon: Toney Reil, MD;  Location: [facility] ENDOSCOPY;  Service: Gastroenterology;  Laterality: N/A;   SPINAL FUSION     WISDOM TOOTH EXTRACTION     TWO    Family History  Problem Relation Age of Onset   Diabetes Mother        Type 1   Breast cancer Maternal Grandmother        age unknown   Other Father        Polio   Muscular dystrophy Sister     Social History   Socioeconomic History   Marital status: Married    Spouse name: Not on file   Number of children: Not on file   Years of education: Not on file   Highest  education level: Not on file  Occupational History   Not on file  Tobacco Use   Smoking status: Former   Smokeless tobacco: Never   Tobacco comments:    quit 2009  Vaping Use   Vaping status: Never Used  Substance and Sexual Activity   Alcohol use: Yes    Comment: social    Drug use: No   Sexual activity: Yes    Birth control/protection: Post-menopausal  Other Topics Concern   Not on file  Social History Narrative   Not on file   Social Drivers of Health   Financial Resource Strain: Patient Declined (08/11/2022)   Received from [facility], [facility]   Overall Financial Resource Strain (CARDIA)    Difficulty of Paying Living Expenses: Patient declined  Food Insecurity: Patient Declined (08/11/2022)   Received from [facility], [facility]   Hunger Vital Sign    Worried About Running Out of Food in the Last Year: Patient declined    Ran Out of Food in the Last Year: Patient declined  Transportation Needs: Patient Declined (08/11/2022)   Received from [facility], [facility]   [facility] - Transportation    In  the past 12 months, has lack of transportation kept you from medical appointments or from getting medications?: Patient declined    Lack of Transportation (Non-Medical): Patient declined  Physical Activity: Not on file  Stress: Not on file  Social Connections: Not on file  Intimate Partner Violence: Not on file    No outpatient medications have been marked as taking for the 06/01/23 encounter (Appointment) with Falon Flinchum, Ilona Sorrel, PA-C.     ROS:  Review of Systems  Constitutional:  Negative for fatigue, fever and unexpected weight change.  Respiratory:  Negative for cough, shortness of breath and wheezing.   Cardiovascular:  Negative for chest pain, palpitations and leg swelling.  Gastrointestinal:  Negative for blood in stool, constipation, diarrhea, nausea  and vomiting.  Endocrine: Negative for cold intolerance, heat intolerance and polyuria.  Genitourinary:  Negative for dyspareunia, dysuria, flank pain, frequency, genital sores, hematuria, menstrual problem, pelvic pain, urgency, vaginal bleeding, vaginal discharge and vaginal pain.  Musculoskeletal:  Negative for back pain, joint swelling and myalgias.  Skin:  Negative for rash.  Neurological:  Negative for dizziness, syncope, light-headedness, numbness and headaches.  Hematological:  Negative for adenopathy.  Psychiatric/Behavioral:  Negative for agitation, confusion, sleep disturbance and suicidal ideas. The patient is not nervous/anxious.      Objective: There were no vitals taken for this visit.   Physical Exam Constitutional:      Appearance: She is well-developed.  Genitourinary:     Vulva normal.     Right Labia: No rash, tenderness or lesions.    Left Labia: No tenderness, lesions or rash.    No vaginal discharge, erythema or tenderness.      Right Adnexa: not tender and no mass present.    Left Adnexa: not tender and no mass present.    No cervical friability or polyp.     Uterus is not enlarged or tender.  Breasts:    Right: No mass, nipple discharge, skin change or tenderness.     Left: No mass, nipple discharge, skin change or tenderness.  Neck:     Thyroid: No thyromegaly.  Cardiovascular:     Rate and Rhythm: Normal rate and regular rhythm.     Heart sounds: Normal heart sounds. No murmur heard. Pulmonary:     Effort: Pulmonary effort is normal.     Breath sounds: Normal breath sounds.  Abdominal:     Palpations: Abdomen is soft.     Tenderness: There is no abdominal tenderness. There is no guarding or rebound.  Musculoskeletal:        General: Normal range of motion.     Cervical back: Normal range of motion.  Lymphadenopathy:     Cervical: No cervical adenopathy.  Neurological:     General: No focal deficit present.     Mental Status: She is alert and  oriented to person, place, and time.     Cranial Nerves: No cranial nerve deficit.  Skin:    General: Skin is warm and dry.  Psychiatric:        Mood and Affect: Mood normal.        Behavior: Behavior normal.        Thought Content: Thought content normal.        Judgment: Judgment normal.  Vitals reviewed.     Assessment/Plan: Encounter for annual routine gynecological examination  Encounter for screening mammogram for malignant neoplasm of breast - Plan: MM 3D SCREEN BREAST BILATERAL; pt current on mammo  Perimenopause--f/u prn AUB.  GYN counsel breast self exam, mammography screening, adequate intake of calcium and vitamin D, diet and exercise     F/U  No follow-ups on file.  Dodd Schmid B. Tanee Henery, PA-C 05/31/2023 8:22 PM

## 2023-06-01 ENCOUNTER — Other Ambulatory Visit (HOSPITAL_COMMUNITY)
Admission: RE | Admit: 2023-06-01 | Discharge: 2023-06-01 | Disposition: A | Discharge status: Home / Self Care | Source: Ambulatory Visit | Attending: Obstetrics and Gynecology | Admitting: Obstetrics and Gynecology

## 2023-06-01 ENCOUNTER — Encounter: Payer: Self-pay | Admitting: Obstetrics and Gynecology

## 2023-06-01 ENCOUNTER — Ambulatory Visit (INDEPENDENT_AMBULATORY_CARE_PROVIDER_SITE_OTHER): Payer: Managed Care, Other (non HMO) | Admitting: Obstetrics and Gynecology

## 2023-06-01 VITALS — BP 100/68 | Ht 65.5 in | Wt 178.0 lb

## 2023-06-01 DIAGNOSIS — Z1151 Encounter for screening for human papillomavirus (HPV): Secondary | ICD-10-CM | POA: Insufficient documentation

## 2023-06-01 DIAGNOSIS — Z124 Encounter for screening for malignant neoplasm of cervix: Secondary | ICD-10-CM | POA: Insufficient documentation

## 2023-06-01 DIAGNOSIS — Z1231 Encounter for screening mammogram for malignant neoplasm of breast: Secondary | ICD-10-CM

## 2023-06-01 DIAGNOSIS — N951 Menopausal and female climacteric states: Secondary | ICD-10-CM

## 2023-06-01 DIAGNOSIS — Z01419 Encounter for gynecological examination (general) (routine) without abnormal findings: Secondary | ICD-10-CM | POA: Diagnosis not present

## 2023-06-01 NOTE — Patient Instructions (Signed)
 I value your feedback and you entrusting [place] with your care. If you get a [facility] patient survey, I would appreciate you taking the time to let us know about your experience today. Thank you! ? ? ?

## 2023-06-04 LAB — CYTOLOGY - PAP
Comment: NEGATIVE
Diagnosis: NEGATIVE
High risk HPV: NEGATIVE

## 2023-10-11 ENCOUNTER — Encounter: Payer: Self-pay | Admitting: Neurosurgery

## 2023-10-11 NOTE — Progress Notes (Unsigned)
 Referring Physician:  Valora Agent, MD [address] [place],  [place] [address]  Primary Physician:  Valora Agent, MD  History of Present Illness: 10/15/2023 Alicia Woods is here today with a chief complaint of worsening back and right leg pain.  She experiences pain starting in the buttock and radiating to the calf, with tingling in the foot and calf. The pain is most severe in the morning and worsens again towards the end of the day. She has been unable to sleep in a bed for over a year, using a recliner instead, which is now also uncomfortable.  The pain began after lifting her mother and has progressively worsened, initially concentrated in the hip and now spreading to the back. Standing for long periods and bending over cause significant discomfort.  She has tried injections, chiropractic care, dry needling, physical therapy, and ultrasound injections without relief. Injections in the hip and back were ineffective. An MRI revealed two bulging discs and hip bursitis, leading to further ineffective injections.  She has been discharged from physical therapy with a home exercise plan, but her condition is worsening. Pain significantly impacts daily activities, such as dressing and climbing stairs, which exacerbates buttock and hip pain.  Bowel/Bladder Dysfunction: none  Conservative measures: massage therapy, chiropractic.  Physical therapy:  has participated in PT with [facility] June 2025 -1 or 2 times? Multimodal medical therapy including regular antiinflammatories:  Prednisone, Ibuprofen , Flexeril, Tramadol  Injections:  07/02/2023: Right L4-5 transforaminal ESI  Procedures: 04/29/2023: Right SI joint injection Dr. Sharrie (no relief) 03/29/2023: Right greater trochanteric bursa injection Dr. Sharrie (no relief) 09/22/2022: Right trochanteric bursa injection Dr. Valora (no relief) 11/19/2020: Left C2-3 and C4-5 facet joint injections (improvement of  pain located at the C2-3 level, no relief of the pain located at C4-5) 06/01/2019: Left C4-5 transforaminal ESI (little relief) 02/22/2018: Left trapezius ridge trigger point injection (little relief) 01/25/2018: Left C4-5 transforaminal ESI (good relief) 12/20/2017: Left C4-5 transforaminal ESI (mild to moderate relief) 01/29/2017: Left C4-5 facet joint injection (55% relief) 10/29/2016: Left C4-5 transforaminal ESI (80% relief) 07/28/2016: Left C4-5 transforaminal ESI (50-60% relief)   Past Surgery:  BACK SURGERY 05/2007  L4-L5   SPINAL FUSION L4-L5 MARCH 2010   Alicia Woods has no symptoms of cervical myelopathy.  The symptoms are causing a significant impact on the patient's life.   I have utilized the care everywhere function in epic to review the outside records available from external health systems.  Review of Systems:  A 10 point review of systems is negative, except for the pertinent positives and negatives detailed in the HPI.  Past Medical History: Past Medical History:  Diagnosis Date   Esophageal reflux    Hyperlipidemia    Lumbar spinal stenosis    Neck pain on left side    CAUSED BY CYST AND CAUSES SHOULDER PAIN;  DR. AVANELL    Past Surgical History: Past Surgical History:  Procedure Laterality Date   ANKLE SURGERY Left    COLONOSCOPY WITH PROPOFOL  N/A 03/07/2018   Procedure: COLONOSCOPY WITH PROPOFOL ;  Surgeon: Unk Corinn Skiff, MD;  Location: [facility] ENDOSCOPY;  Service: Gastroenterology;  Laterality: N/A;   SPINAL FUSION     L4-L5   WISDOM TOOTH EXTRACTION     TWO    Allergies: Allergies as of 10/15/2023 - Review Complete 10/15/2023  Allergen Reaction Noted   Ciprofloxacin Hives    Ciprocinonide [fluocinolone] Itching 01/31/2013   Robitussin (alcohol free) [guaifenesin] Swelling 01/31/2013  Penicillins Rash 01/31/2013    Medications:  Current Outpatient Medications:    acetaminophen  (TYLENOL ) 325 MG tablet, Take 650 mg by mouth every 6  (six) hours as needed., Disp: , Rfl:    Calcium Citrate 250 MG TABS, Take 1 capsule daily by mouth., Disp: , Rfl:    Cholecalciferol (VITAMIN D3) 2000 units capsule, Take 1 capsule daily by mouth., Disp: , Rfl:    diphenhydrAMINE (BENADRYL) 25 mg capsule, Take 1-2 tabs PO QHS, Disp: , Rfl:    escitalopram (LEXAPRO) 10 MG tablet, Take 10 mg by mouth daily., Disp: , Rfl:    fluticasone (FLONASE) 50 MCG/ACT nasal spray, Place into the nose., Disp: , Rfl:    gabapentin (NEURONTIN) 300 MG capsule, Take 1 capsule daily by mouth., Disp: , Rfl:    loratadine (CLARITIN) 10 MG tablet, Take by mouth., Disp: , Rfl:    omeprazole (PRILOSEC) 20 MG capsule, Take 1 capsule daily by mouth., Disp: , Rfl:    Potassium 99 MG TABS, Take 1 tablet daily by mouth., Disp: , Rfl:    scopolamine (TRANSDERM-SCOP) 1 MG/3DAYS, Place 1 mg onto the skin., Disp: , Rfl:    triamcinolone  ointment (KENALOG ) 0.1 %, Apply topically 2 (two) times daily., Disp: , Rfl:    VITAMIN B COMPLEX-C CAPS, Take 1 capsule daily by mouth., Disp: , Rfl:    ibuprofen  (ADVIL ) 800 MG tablet, TAKE 1 TABLET BY MOUTH EVERY 6 HOURS AS NEEDED., Disp: 60 tablet, Rfl: 1  Social History: Social History   Tobacco Use   Smoking status: Former   Smokeless tobacco: Never   Tobacco comments:    quit 2009  Vaping Use   Vaping status: Never Used  Substance Use Topics   Alcohol use: Yes    Comment: rarely   Drug use: No    Family Medical History: Family History  Problem Relation Age of Onset   Diabetes Mother        Type 1   Bladder Cancer Mother    Other Father        Polio   Muscular dystrophy Sister    Breast cancer Maternal Grandmother        age unknown    Physical Examination: Vitals:   10/15/23 1113  BP: 114/80    General: Patient is in no apparent distress. Attention to examination is appropriate.  Neck:   Supple.  Full range of motion.  Respiratory: Patient is breathing without any difficulty.   NEUROLOGICAL:     Awake,  alert, oriented to person, place, and time.  Speech is clear and fluent.   Cranial Nerves: Pupils equal round and reactive to light.  Facial tone is symmetric.  Facial sensation is symmetric. Shoulder shrug is symmetric. Tongue protrusion is midline.  There is no pronator drift.  Strength: Side Biceps Triceps Deltoid Interossei Grip Wrist Ext. Wrist Flex.  R 5 5 5 5 5 5 5   L 5 5 5 5 5 5 5    Side Iliopsoas Quads Hamstring PF DF EHL  R 5 5 5 5 5 5   L 5 5 5 5 5 5    Reflexes are 1+ and symmetric at the biceps, triceps, brachioradialis, patella and achilles.   Hoffman's is absent.   Bilateral upper and lower extremity sensation is intact to light touch.    No evidence of dysmetria noted.  Gait is antalgic and stooped forward.     Medical Decision Making  Imaging: MRI L spine 05/27/2023 Alignment: Grade 1 anterolisthesis of  L5 on S1 secondary to facet disease. 2 mm retrolisthesis of L2 on L3 and L3 on L4.   Vertebrae: No acute fracture, evidence of discitis, or aggressive bone lesion.   Conus medullaris and cauda equina: Conus extends to the L1 level. Conus and cauda equina appear normal.   Paraspinal and other soft tissues: No acute paraspinal abnormality.   Disc levels:   Disc spaces: Interbody fusion at L4-5. Mild disc height loss at L2-3, L3-4 and L5-S1.   T12-L1: No significant disc bulge. Mild bilateral facet arthropathy. No foraminal or central canal stenosis.   L1-L2: No significant disc bulge. Mild bilateral facet arthropathy. No foraminal or central canal stenosis.   L2-L3: Mild disc bulge with a small central disc protrusion. Mild bilateral facet arthropathy. Mild-moderate central canal stenosis and bilateral lateral recess stenosis. Mild bilateral foraminal stenosis.   L3-L4: Mild disc bulge with a small central disc protrusion. Moderate bilateral facet arthropathy. Moderate-severe central canal stenosis. Bilateral lateral recess stenosis. No foraminal  stenosis.   L4-L5: Interbody fusion. No foraminal or central canal stenosis. Mild bilateral facet arthropathy.   L5-S1: Mild disc bulge. Severe bilateral facet arthropathy. Mild bilateral lateral recess stenosis. No foraminal or central canal stenosis. IMPRESSION: 1. At L2-3 there is a mild disc bulge with a small central disc protrusion. Mild bilateral facet arthropathy. Mild-moderate central canal stenosis and bilateral lateral recess stenosis. Mild bilateral foraminal stenosis. 2. At L3-4 there is a mild disc bulge with a small central disc protrusion. Moderate bilateral facet arthropathy. Moderate-severe central canal stenosis. Bilateral lateral recess stenosis. 3. At L5-S1 there is a mild disc bulge. Severe bilateral facet arthropathy. Mild bilateral lateral recess stenosis. 4. No acute osseous injury of the lumbar spine.     Electronically Signed   By: Julaine Blanch M.D.   On: 06/04/2023 15:18  Please note that she has anterolisthesis of L5 on S1.  I measured approximately 7 mm.  This was not present on her MRI scan in 2015.  I have personally reviewed the images and agree with the above interpretation.  Assessment and Plan: Alicia Woods is a pleasant 58 y.o. 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  She has developed spondylolisthesis at both levels with retrolisthesis at L3-4 and anterolisthesis at L5-S1.  Neither of these findings was present on her prior imaging.  She has tried physical therapy but has been discharged and has had worsening back pain over the past several months.  She has been having symptoms for more than a year.  At this point, I do not feel that further conservative management is indicated.  I recommended extension of her fusion to L3-4 and L5-S1 given the development of spondylolisthesis at both of those levels.  I do not feel that decompression alone is appropriate for her given her level of back pain and worsening symptoms.  I have  recommended L3-4 and L5-S1 transforaminal lumbar interbody fusion.  This will be done in minimally invasive fashion.  I discussed the planned procedure at length with the patient, including the risks, benefits, alternatives, and indications. The risks discussed include but are not limited to bleeding, infection, need for reoperation, spinal fluid leak, stroke, vision loss, anesthetic complication, coma, paralysis, and even death. I also described in detail that improvement was not guaranteed.  The patient expressed understanding of these risks, and asked that we proceed with surgery. I described the surgery in layman's terms, and gave ample opportunity for questions, which were answered to the best  of my ability.  She has some upcoming travel and will contact us  when she would like to move forward with surgical intervention.   I spent a total of 30 minutes in this patient's care today. This time was spent reviewing pertinent records including imaging studies, obtaining and confirming history, performing a directed evaluation, formulating and discussing my recommendations, and documenting the visit within the medical record.    Thank you for involving me in the care of this patient.      Aziah Kaiser K. Clois MD, [facility]

## 2023-10-14 ENCOUNTER — Ambulatory Visit: Admitting: Neurosurgery

## 2023-10-15 ENCOUNTER — Ambulatory Visit: Admitting: Neurosurgery

## 2023-10-15 ENCOUNTER — Encounter: Payer: Self-pay | Admitting: Neurosurgery

## 2023-10-15 VITALS — BP 114/80 | Ht 65.5 in | Wt 178.0 lb

## 2023-10-15 DIAGNOSIS — M5136 Other intervertebral disc degeneration, lumbar region with discogenic back pain only: Secondary | ICD-10-CM

## 2023-10-15 DIAGNOSIS — M4316 Spondylolisthesis, lumbar region: Secondary | ICD-10-CM | POA: Diagnosis not present

## 2023-10-15 DIAGNOSIS — M48062 Spinal stenosis, lumbar region with neurogenic claudication: Secondary | ICD-10-CM

## 2023-10-15 DIAGNOSIS — M51369 Other intervertebral disc degeneration, lumbar region without mention of lumbar back pain or lower extremity pain: Secondary | ICD-10-CM

## 2023-10-21 ENCOUNTER — Ambulatory Visit: Admitting: Neurosurgery

## 2024-01-04 ENCOUNTER — Encounter: Payer: Self-pay | Admitting: Neurosurgery

## 2024-02-07 ENCOUNTER — Ambulatory Visit
Admission: RE | Admit: 2024-02-07 | Discharge: 2024-02-07 | Disposition: A | Discharge status: Home / Self Care | Source: Ambulatory Visit | Attending: Obstetrics and Gynecology | Admitting: Obstetrics and Gynecology

## 2024-02-07 DIAGNOSIS — Z1231 Encounter for screening mammogram for malignant neoplasm of breast: Secondary | ICD-10-CM | POA: Insufficient documentation

## 2024-02-11 ENCOUNTER — Ambulatory Visit: Payer: Self-pay | Admitting: Obstetrics and Gynecology
